# Patient Record
Sex: Male | Born: 1975
Health system: Southern US, Community
[De-identification: ages and names within clinical notes are randomized; demographics above are authoritative.]

## PROBLEM LIST (undated history)

## (undated) DIAGNOSIS — T8859XA Other complications of anesthesia, initial encounter: Secondary | ICD-10-CM

## (undated) DIAGNOSIS — E559 Vitamin D deficiency, unspecified: Secondary | ICD-10-CM

## (undated) DIAGNOSIS — K4090 Unilateral inguinal hernia, without obstruction or gangrene, not specified as recurrent: Secondary | ICD-10-CM

## (undated) DIAGNOSIS — N429 Disorder of prostate, unspecified: Secondary | ICD-10-CM

## (undated) DIAGNOSIS — T4145XA Adverse effect of unspecified anesthetic, initial encounter: Secondary | ICD-10-CM

## (undated) DIAGNOSIS — R55 Syncope and collapse: Secondary | ICD-10-CM

## (undated) DIAGNOSIS — F419 Anxiety disorder, unspecified: Secondary | ICD-10-CM

## (undated) HISTORY — DX: Disorder of prostate, unspecified: N42.9

## (undated) HISTORY — PX: HERNIA REPAIR: SHX51

## (undated) HISTORY — DX: Syncope and collapse: R55

## (undated) HISTORY — DX: Vitamin D deficiency, unspecified: E55.9

## (undated) HISTORY — DX: Unilateral inguinal hernia, without obstruction or gangrene, not specified as recurrent: K40.90

## (undated) HISTORY — PX: OTHER SURGICAL HISTORY: SHX169

---

## 2001-07-14 ENCOUNTER — Encounter: Payer: Self-pay | Admitting: Pediatrics

## 2001-07-14 ENCOUNTER — Encounter: Admission: RE | Admit: 2001-07-14 | Discharge: 2001-07-14 | Payer: Self-pay | Admitting: Pediatrics

## 2009-03-21 ENCOUNTER — Encounter: Admission: RE | Admit: 2009-03-21 | Discharge: 2009-03-21 | Payer: Self-pay | Admitting: Cardiovascular Disease

## 2011-06-05 ENCOUNTER — Encounter (INDEPENDENT_AMBULATORY_CARE_PROVIDER_SITE_OTHER): Payer: Self-pay | Admitting: Surgery

## 2011-06-05 ENCOUNTER — Ambulatory Visit (INDEPENDENT_AMBULATORY_CARE_PROVIDER_SITE_OTHER): Payer: BC Managed Care – PPO | Admitting: Surgery

## 2011-06-05 VITALS — BP 118/76 | HR 72 | Temp 97.9°F | Resp 18 | Ht 65.0 in | Wt 131.6 lb

## 2011-06-05 DIAGNOSIS — K4090 Unilateral inguinal hernia, without obstruction or gangrene, not specified as recurrent: Secondary | ICD-10-CM

## 2011-06-05 NOTE — Patient Instructions (Signed)
Inguinal Hernia, Adult  Muscles help keep everything in the body in its proper place. But if a weak spot in the muscles develops, something can poke through. That is called a hernia. When this happens in the lower part of the belly (abdomen), it is called an inguinal hernia. (It takes its name from a part of the body in this region called the inguinal canal.) A weak spot in the wall of muscles lets some fat or part of the small intestine bulge through. An inguinal hernia can develop at any age. Men get them more often than women.  CAUSES   In adults, an inguinal hernia develops over time.  · It can be triggered by:  · Suddenly straining the muscles of the lower abdomen.  · Lifting heavy objects.  · Straining to have a bowel movement. Difficult bowel movements (constipation) can lead to this.  · Constant coughing. This may be caused by smoking or lung disease.  · Being overweight.  · Being pregnant.  · Working at a job that requires long periods of standing or heavy lifting.  · Having had an inguinal hernia before.  One type can be an emergency situation. It is called a strangulated inguinal hernia. It develops if part of the small intestine slips through the weak spot and cannot get back into the abdomen. The blood supply can be cut off. If that happens, part of the intestine may die. This situation requires emergency surgery.  SYMPTOMS   Often, a small inguinal hernia has no symptoms. It is found when a healthcare provider does a physical exam. Larger hernias usually have symptoms.   · In adults, symptoms may include:  · A lump in the groin. This is easier to see when the person is standing. It might disappear when lying down.  · In men, a lump in the scrotum.  · Pain or burning in the groin. This occurs especially when lifting, straining or coughing.  · A dull ache or feeling of pressure in the groin.  · Signs of a strangulated hernia can include:  · A bulge in the groin that becomes very painful and tender to the  touch.  · A bulge that turns red or purple.  · Fever, nausea and vomiting.  · Inability to have a bowel movement or to pass gas.  DIAGNOSIS   To decide if you have an inguinal hernia, a healthcare provider will probably do a physical examination.  · This will include asking questions about any symptoms you have noticed.  · The healthcare provider might feel the groin area and ask you to cough. If an inguinal hernia is felt, the healthcare provider may try to slide it back into the abdomen.  · Usually no other tests are needed.  TREATMENT   Treatments can vary. The size of the hernia makes a difference. Options include:  · Watchful waiting. This is often suggested if the hernia is small and you have had no symptoms.  · No medical procedure will be done unless symptoms develop.  · You will need to watch closely for symptoms. If any occur, contact your healthcare provider right away.  · Surgery. This is used if the hernia is larger or you have symptoms.  · Open surgery. This is usually an outpatient procedure (you will not stay overnight in a hospital). An cut (incision) is made through the skin in the groin. The hernia is put back inside the abdomen. The weak area in the muscles is   then repaired by herniorrhaphy or hernioplasty. Herniorrhaphy: in this type of surgery, the weak muscles are sewn back together. Hernioplasty: a patch or mesh is used to close the weak area in the abdominal wall.  · Laparoscopy. In this procedure, a surgeon makes small incisions. A thin tube with a tiny video camera (called a laparoscope) is put into the abdomen. The surgeon repairs the hernia with mesh by looking with the video camera and using two long instruments.  HOME CARE INSTRUCTIONS   · After surgery to repair an inguinal hernia:  · You will need to take pain medicine prescribed by your healthcare provider. Follow all directions carefully.  · You will need to take care of the wound from the incision.  · Your activity will be  restricted for awhile. This will probably include no heavy lifting for several weeks. You also should not do anything too active for a few weeks. When you can return to work will depend on the type of job that you have.  · During "watchful waiting" periods, you should:  · Maintain a healthy weight.  · Eat a diet high in fiber (fruits, vegetables and whole grains).  · Drink plenty of fluids to avoid constipation. This means drinking enough water and other liquids to keep your urine clear or pale yellow.  · Do not lift heavy objects.  · Do not stand for long periods of time.  · Quit smoking. This should keep you from developing a frequent cough.  SEEK MEDICAL CARE IF:   · A bulge develops in your groin area.  · You feel pain, a burning sensation or pressure in the groin. This might be worse if you are lifting or straining.  · You develop a fever of more than 100.5° F (38.1° C).  SEEK IMMEDIATE MEDICAL CARE IF:   · Pain in the groin increases suddenly.  · A bulge in the groin gets bigger suddenly and does not go down.  · For men, there is sudden pain in the scrotum. Or, the size of the scrotum increases.  · A bulge in the groin area becomes red or purple and is painful to touch.  · You have nausea or vomiting that does not go away.  · You feel your heart beating much faster than normal.  · You cannot have a bowel movement or pass gas.  · You develop a fever of more than 102.0° F (38.9° C).  Document Released: 07/28/2008 Document Revised: 02/28/2011 Document Reviewed: 07/28/2008  Exit[employer]® Patient Information ©2012 [employer].

## 2011-06-05 NOTE — Progress Notes (Signed)
Chief Complaint  Patient presents with  . Inguinal Hernia    Right inguinal hernia - referral by Dr. Marcine Matar, [facility]    HISTORY: Patient is a 36 year old white male referred by his urologist for right inguinal hernia. Patient has noted a bulge on the right side for several months. It causes occasional discomfort with physical activity. He has had no signs or symptoms of obstruction. Patient had a pediatric hernia repaired on the left side. He has had problems with left varicocele and had laparoscopic surgery. He has chronic left lower quadrant abdominal pain. Patient presents today for evaluation for repair of right inguinal hernia.  Past Medical History  Diagnosis Date  . Inguinal hernia without mention of obstruction or gangrene, unilateral or unspecified, (not specified as recurrent)   . Unspecified disorder of prostate   . Abdominal pain   . Syncope, vasovagal      No current outpatient prescriptions on file.     No Known Allergies   Family History  Problem Relation Age of Onset  . Nephrolithiasis    . Cancer Paternal Aunt     breast  . Cancer Paternal Grandmother     lung     History   Social History  . Marital Status: Single    Spouse Name: N/A    Number of Children: N/A  . Years of Education: N/A   Social History Main Topics  . Smoking status: Never Smoker   . Smokeless tobacco: Never Used  . Alcohol Use: No  . Drug Use: No  . Sexually Active: None   Other Topics Concern  . None   Social History Narrative  . None     REVIEW OF SYSTEMS - PERTINENT POSITIVES ONLY: Intermittent discomfort right groin with physical activity. No history of intestinal obstruction. Chronic left lower quadrant abdominal pain.  EXAM: Filed Vitals:   06/05/11 1036  BP: 118/76  Pulse: 72  Temp: 97.9 F (36.6 C)  Resp: 18    HEENT: normocephalic; pupils equal and reactive; sclerae clear; dentition good; mucous membranes moist NECK:  symmetric on  extension; no palpable anterior or posterior cervical lymphadenopathy; no supraclavicular masses; no tenderness CHEST: clear to auscultation bilaterally without rales, rhonchi, or wheezes CARDIAC: regular rate and rhythm without significant murmur; peripheral pulses are full ABDOMEN: soft without distension; bowel sounds present; no mass; no hepatosplenomegaly; no hernia; Well-healed surgical incisions. Palpation in the right inguinal canal with cough and Valsalva shows an obvious direct inguinal hernia. This is easily reducible. Palpation in the left inguinal canal with cough and Valsalva shows no sign of inguinal hernia. There is slight laxity of the inguinal floor. EXT:  non-tender without edema; no deformity NEURO: no gross focal deficits; no sign of tremor   LABORATORY RESULTS: See [facility] (CHL-Epic) for most recent results   RADIOLOGY RESULTS: See [facility] (CHL-Epic) for most recent results   IMPRESSION: #1 right inguinal hernia, reducible, mildly symptomatic #2 history of left inguinal hernia repaired as an infant, history of left varicocele repaired laparoscopically #3 history of flash pulmonary edema with anesthesia  PLAN: The patient and I discussed all of the above issues. We discussed the repair of the right inguinal hernia at length. I provided him with written literature. Patient would like to proceed with repair in the near future.  We discussed general anesthesia. We discussed his episode of flash pulmonary edema with a previous procedure. In view of this complication, I would like to have this procedure performed at  the main hospital operating room. We will arrange for preoperative anesthesia consultation. Patient will still be eligible for outpatient surgery.  The patient and I discussed right inguinal hernia repair with mesh. We discussed alternative techniques for repair including laparoscopic surgery. I have quoted him approximately a 2% risk of  recurrence with open repair. We discussed the risk and benefits. He would like to proceed in the near future.  The risks and benefits of the procedure have been discussed at length with the patient.  The patient understands the proposed procedure, potential alternative treatments, and the course of recovery to be expected.  All of the patient's questions have been answered at this time.  The patient wishes to proceed with surgery and will schedule a date for the procedure through our office staff.  Velora Heckler, MD, FACS General & Endocrine Surgery [facility]   Visit Diagnoses: 1. Inguinal hernia unilateral, non-recurrent, right     Primary Care Physician: Jefferey Pica, MD, MD  Urology:  Dr. Marcine Matar

## 2011-06-18 ENCOUNTER — Ambulatory Visit (INDEPENDENT_AMBULATORY_CARE_PROVIDER_SITE_OTHER): Payer: Self-pay | Admitting: General Surgery

## 2011-06-24 ENCOUNTER — Encounter (HOSPITAL_COMMUNITY): Payer: Self-pay | Admitting: Pharmacy Technician

## 2011-07-03 ENCOUNTER — Encounter (HOSPITAL_COMMUNITY)
Admission: RE | Admit: 2011-07-03 | Discharge: 2011-07-03 | Disposition: A | Discharge status: Home / Self Care | Payer: BC Managed Care – PPO | Source: Ambulatory Visit | Attending: Surgery | Admitting: Surgery

## 2011-07-03 ENCOUNTER — Encounter (HOSPITAL_COMMUNITY): Payer: Self-pay

## 2011-07-03 HISTORY — DX: Other complications of anesthesia, initial encounter: T88.59XA

## 2011-07-03 HISTORY — DX: Anxiety disorder, unspecified: F41.9

## 2011-07-03 HISTORY — DX: Adverse effect of unspecified anesthetic, initial encounter: T41.45XA

## 2011-07-03 NOTE — Patient Instructions (Signed)
YOUR SURGERY IS SCHEDULED ON:  Friday 4/12  AT 7:30 AM  REPORT TO [facility] AT: 5:30 AM      PHONE # FOR SHORT STAY IS [phone]  DO NOT EAT OR DRINK ANYTHING AFTER MIDNIGHT THE NIGHT BEFORE YOUR SURGERY.  YOU MAY BRUSH YOUR TEETH, RINSE OUT YOUR MOUTH--BUT NO WATER, NO FOOD, NO CHEWING GUM, NO MINTS, NO CANDIES, NO CHEWING TOBACCO.  PLEASE TAKE THE FOLLOWING MEDICATIONS THE AM OF YOUR SURGERY WITH A FEW SIPS OF WATER:  NO MEDS TO TAKE    IF YOU USE INHALERS--USE YOUR INHALERS THE AM OF YOUR SURGERY AND BRING INHALERS TO THE HOSPITAL -TAKE TO SURGERY.    IF YOU ARE DIABETIC:  DO NOT TAKE ANY DIABETIC MEDICATIONS THE AM OF YOUR SURGERY.  IF YOU TAKE INSULIN IN THE EVENINGS--PLEASE ONLY TAKE 1/2 NORMAL EVENING DOSE THE NIGHT BEFORE YOUR SURGERY.  NO INSULIN THE AM OF YOUR SURGERY.  IF YOU HAVE SLEEP APNEA AND USE CPAP OR BIPAP--PLEASE BRING THE MASK --NOT THE MACHINE-NOT THE TUBING   -JUST THE MASK. DO NOT BRING VALUABLES, MONEY, CREDIT CARDS.  CONTACT LENS, DENTURES / PARTIALS, GLASSES SHOULD NOT BE WORN TO SURGERY AND IN MOST CASES-HEARING AIDS WILL NEED TO BE REMOVED.  BRING YOUR GLASSES CASE, ANY EQUIPMENT NEEDED FOR YOUR CONTACT LENS. FOR PATIENTS ADMITTED TO THE HOSPITAL--CHECK OUT TIME THE DAY OF DISCHARGE IS 11:00 AM.  ALL INPATIENT ROOMS ARE PRIVATE - WITH BATHROOM, TELEPHONE, TELEVISION AND WIFI INTERNET. IF YOU ARE BEING DISCHARGED THE SAME DAY OF YOUR SURGERY--YOU CAN NOT DRIVE YOURSELF HOME--AND SHOULD NOT GO HOME ALONE BY TAXI OR BUS.  NO DRIVING OR OPERATING MACHINERY FOR 24 HOURS FOLLOWING ANESTHESIA / PAIN MEDICATIONS.                            SPECIAL INSTRUCTIONS:  CHLORHEXIDINE SOAP SHOWER (other brand names are Betasept and Hibiclens ) PLEASE SHOWER WITH CHLORHEXIDINE THE NIGHT BEFORE YOUR SURGERY AND THE AM OF YOUR SURGERY. DO NOT USE CHLORHEXIDINE ON YOUR FACE OR PRIVATE AREAS--YOU MAY USE YOUR NORMAL SOAP THOSE AREAS AND YOUR NORMAL SHAMPOO.    WOMEN SHOULD AVOID SHAVING UNDER ARMS AND SHAVING LEGS 48 HOURS BEFORE USING CHLORHEXIDINE TO AVOID SKIN IRRITATION.  DO NOT USE IF ALLERGIC TO CHLORHEXIDINE.  PLEASE READ OVER ANY  FACT SHEETS THAT YOU WERE GIVEN: MRSA INFORMATION

## 2011-07-03 NOTE — Pre-Procedure Instructions (Signed)
ANESTHESIOLGIST CONSULT WAS COMPLETED TODAY BY DR. ROSE AND DR. ROSE STATED PT DID NOT NEED ANY PREOP LABS, EKG OR CXR.  DR. ROSE DID REVIEW PT'S LAST CARDIOLOGY OFFICE NOTE WITH EKG FROM 08/27/10 -DR. [facility] AND [facility] EXERCISE STRESS TEST REPORT 03/23/2009 AND ECHO REPORT 03/21/09 FROM [facility].  REPORTS ARE ON PT'S CHART.  MR C SPINE REPORT ON CHART FROM 03/21/09 FROM [facility].

## 2011-07-03 NOTE — Anesthesia Preprocedure Evaluation (Addendum)
Anesthesia Evaluation  Patient identified by MRN, date of birth, ID band Patient awake  General Assessment Comment:H/o flash pulmonary edema  Reviewed: Allergy & Precautions, H&P , NPO status , Patient's Chart, lab work & pertinent test results, reviewed documented beta blocker date and time   Airway Mallampati: II TM Distance: >3 FB Neck ROM: Full    Dental No notable dental hx.    Pulmonary neg pulmonary ROS,  breath sounds clear to auscultation  Pulmonary exam normal       Cardiovascular negative cardio ROS  Rhythm:Regular Rate:Normal     Neuro/Psych negative neurological ROS  negative psych ROS   GI/Hepatic negative GI ROS, Neg liver ROS,   Endo/Other  negative endocrine ROS  Renal/GU negative Renal ROS  negative genitourinary   Musculoskeletal negative musculoskeletal ROS (+)   Abdominal   Peds negative pediatric ROS (+)  Hematology negative hematology ROS (+)   Anesthesia Other Findings   Reproductive/Obstetrics negative OB ROS                          Anesthesia Physical Anesthesia Plan  ASA: I  Anesthesia Plan: General   Post-op Pain Management:    Induction: Intravenous  Airway Management Planned: LMA  Additional Equipment:   Intra-op Plan:   Post-operative Plan:   Informed Consent: I have reviewed the patients History and Physical, chart, labs and discussed the procedure including the risks, benefits and alternatives for the proposed anesthesia with the patient or authorized representative who has indicated his/her understanding and acceptance.   Dental advisory given  Plan Discussed with: CRNA  Anesthesia Plan Comments:         Anesthesia Quick Evaluation

## 2011-07-05 ENCOUNTER — Encounter (HOSPITAL_COMMUNITY): Payer: Self-pay | Admitting: Registered Nurse

## 2011-07-05 ENCOUNTER — Encounter (HOSPITAL_COMMUNITY): Payer: Self-pay | Admitting: *Deleted

## 2011-07-05 ENCOUNTER — Encounter (HOSPITAL_COMMUNITY)
Admission: RE | Disposition: A | Discharge status: Home / Self Care | Payer: Self-pay | Source: Ambulatory Visit | Attending: Surgery

## 2011-07-05 ENCOUNTER — Telehealth (INDEPENDENT_AMBULATORY_CARE_PROVIDER_SITE_OTHER): Payer: Self-pay

## 2011-07-05 ENCOUNTER — Ambulatory Visit (HOSPITAL_COMMUNITY)
Admission: RE | Admit: 2011-07-05 | Discharge: 2011-07-05 | Disposition: A | Discharge status: Home / Self Care | Payer: BC Managed Care – PPO | Source: Ambulatory Visit | Attending: Surgery | Admitting: Surgery

## 2011-07-05 ENCOUNTER — Encounter (HOSPITAL_COMMUNITY): Payer: Self-pay | Admitting: Anesthesiology

## 2011-07-05 ENCOUNTER — Ambulatory Visit (HOSPITAL_COMMUNITY): Payer: BC Managed Care – PPO | Admitting: Registered Nurse

## 2011-07-05 DIAGNOSIS — K4091 Unilateral inguinal hernia, without obstruction or gangrene, recurrent: Secondary | ICD-10-CM

## 2011-07-05 DIAGNOSIS — Z01812 Encounter for preprocedural laboratory examination: Secondary | ICD-10-CM | POA: Insufficient documentation

## 2011-07-05 DIAGNOSIS — K4090 Unilateral inguinal hernia, without obstruction or gangrene, not specified as recurrent: Secondary | ICD-10-CM

## 2011-07-05 HISTORY — PX: INGUINAL HERNIA REPAIR: SHX194

## 2011-07-05 SURGERY — REPAIR, HERNIA, INGUINAL, ADULT
Anesthesia: General | Site: Abdomen | Laterality: Right | Wound class: Clean

## 2011-07-05 MED ORDER — ACETAMINOPHEN 10 MG/ML IV SOLN
1000.0000 mg | Freq: Once | INTRAVENOUS | Status: AC
  Administered 2011-07-05: 1000 mg via INTRAVENOUS
  Filled 2011-07-05: qty 100

## 2011-07-05 MED ORDER — HYDROCODONE-ACETAMINOPHEN 7.5-500 MG/15ML PO SOLN
15.0000 mL | ORAL | Status: DC | PRN
  Administered 2011-07-05: 15 mL via ORAL

## 2011-07-05 MED ORDER — HYDROMORPHONE HCL PF 1 MG/ML IJ SOLN: 0.2500 mg | INTRAMUSCULAR | Status: DC | PRN

## 2011-07-05 MED ORDER — BUPIVACAINE HCL (PF) 0.5 % IJ SOLN
INTRAMUSCULAR | Status: AC
  Filled 2011-07-05: qty 30

## 2011-07-05 MED ORDER — PROPOFOL 10 MG/ML IV EMUL
INTRAVENOUS | Status: DC | PRN
  Administered 2011-07-05: 200 mg via INTRAVENOUS
  Administered 2011-07-05: 70 mg via INTRAVENOUS

## 2011-07-05 MED ORDER — DROPERIDOL 2.5 MG/ML IJ SOLN
INTRAMUSCULAR | Status: DC | PRN
  Administered 2011-07-05: 0.625 mg via INTRAVENOUS

## 2011-07-05 MED ORDER — BUPIVACAINE LIPOSOME 1.3 % IJ SUSP
20.0000 mL | Freq: Once | INTRAMUSCULAR | Status: DC
  Filled 2011-07-05: qty 20

## 2011-07-05 MED ORDER — MIDAZOLAM HCL 10 MG/2ML IJ SOLN
4.0000 mg | Freq: Once | INTRAMUSCULAR | Status: AC
  Administered 2011-07-05: 4 mg via INTRAMUSCULAR

## 2011-07-05 MED ORDER — NEOSTIGMINE METHYLSULFATE 1 MG/ML IJ SOLN
INTRAMUSCULAR | Status: DC | PRN
  Administered 2011-07-05: 4 mg via INTRAVENOUS

## 2011-07-05 MED ORDER — MIDAZOLAM HCL 5 MG/5ML IJ SOLN
INTRAMUSCULAR | Status: DC | PRN
  Administered 2011-07-05: 1 mg via INTRAVENOUS
  Administered 2011-07-05: 2 mg via INTRAVENOUS

## 2011-07-05 MED ORDER — HYDROCODONE-ACETAMINOPHEN 10-325 MG PO TABS: 1.0000 | ORAL_TABLET | ORAL | Status: AC | PRN

## 2011-07-05 MED ORDER — SUFENTANIL CITRATE 50 MCG/ML IV SOLN
INTRAVENOUS | Status: DC | PRN
  Administered 2011-07-05 (×3): 10 ug via INTRAVENOUS

## 2011-07-05 MED ORDER — ONDANSETRON HCL 4 MG/2ML IJ SOLN
INTRAMUSCULAR | Status: DC | PRN
  Administered 2011-07-05: 4 mg via INTRAVENOUS

## 2011-07-05 MED ORDER — LACTATED RINGERS IV SOLN
INTRAVENOUS | Status: DC | PRN
  Administered 2011-07-05 (×2): via INTRAVENOUS

## 2011-07-05 MED ORDER — LIDOCAINE HCL 4 % MT SOLN
OROMUCOSAL | Status: DC | PRN
  Administered 2011-07-05: 4 mL via TOPICAL

## 2011-07-05 MED ORDER — LIDOCAINE HCL (CARDIAC) 20 MG/ML IV SOLN
INTRAVENOUS | Status: DC | PRN
  Administered 2011-07-05: 50 mg via INTRAVENOUS

## 2011-07-05 MED ORDER — 0.9 % SODIUM CHLORIDE (POUR BTL) OPTIME
TOPICAL | Status: DC | PRN
  Administered 2011-07-05: 1000 mL

## 2011-07-05 MED ORDER — BUPIVACAINE HCL (PF) 0.5 % IJ SOLN
INTRAMUSCULAR | Status: DC | PRN
  Administered 2011-07-05: 20 mL

## 2011-07-05 MED ORDER — ROCURONIUM BROMIDE 100 MG/10ML IV SOLN
INTRAVENOUS | Status: DC | PRN
  Administered 2011-07-05: 50 mg via INTRAVENOUS
  Administered 2011-07-05: 10 mg via INTRAVENOUS

## 2011-07-05 MED ORDER — GLYCOPYRROLATE 0.2 MG/ML IJ SOLN
INTRAMUSCULAR | Status: DC | PRN
  Administered 2011-07-05: 0.1 mg via INTRAVENOUS
  Administered 2011-07-05: 0.6 mg via INTRAVENOUS

## 2011-07-05 MED ORDER — MIDAZOLAM HCL 10 MG/2ML IJ SOLN
INTRAMUSCULAR | Status: AC
  Filled 2011-07-05: qty 2

## 2011-07-05 MED ORDER — CEFAZOLIN SODIUM 1-5 GM-% IV SOLN
INTRAVENOUS | Status: AC
  Filled 2011-07-05: qty 50

## 2011-07-05 MED ORDER — LACTATED RINGERS IV SOLN: INTRAVENOUS | Status: DC

## 2011-07-05 MED ORDER — CEFAZOLIN SODIUM 1-5 GM-% IV SOLN
1.0000 g | INTRAVENOUS | Status: AC
  Administered 2011-07-05: 1 g via INTRAVENOUS

## 2011-07-05 SURGICAL SUPPLY — 38 items
APPLICATOR COTTON TIP 6IN STRL (MISCELLANEOUS) IMPLANT
BENZOIN TINCTURE PRP APPL 2/3 (GAUZE/BANDAGES/DRESSINGS) ×2 IMPLANT
BLADE HEX COATED 2.75 (ELECTRODE) ×2 IMPLANT
BLADE SURG 15 STRL LF DISP TIS (BLADE) ×1 IMPLANT
BLADE SURG 15 STRL SS (BLADE) ×1
BLADE SURG SZ10 CARB STEEL (BLADE) ×2 IMPLANT
CANISTER SUCTION 2500CC (MISCELLANEOUS) ×2 IMPLANT
CLOTH BEACON ORANGE TIMEOUT ST (SAFETY) ×2 IMPLANT
DECANTER SPIKE VIAL GLASS SM (MISCELLANEOUS) ×2 IMPLANT
DRAIN PENROSE 18X1/2 LTX STRL (DRAIN) ×2 IMPLANT
DRAPE LAPAROTOMY TRNSV 102X78 (DRAPE) ×2 IMPLANT
ELECT REM PT RETURN 9FT ADLT (ELECTROSURGICAL) ×2
ELECTRODE REM PT RTRN 9FT ADLT (ELECTROSURGICAL) ×1 IMPLANT
GLOVE BIOGEL PI IND STRL 7.0 (GLOVE) ×1 IMPLANT
GLOVE BIOGEL PI INDICATOR 7.0 (GLOVE) ×1
GLOVE SURG ORTHO 8.0 STRL STRW (GLOVE) ×2 IMPLANT
GOWN STRL NON-REIN LRG LVL3 (GOWN DISPOSABLE) ×2 IMPLANT
GOWN STRL REIN XL XLG (GOWN DISPOSABLE) ×4 IMPLANT
KIT BASIN OR (CUSTOM PROCEDURE TRAY) ×2 IMPLANT
MESH ULTRAPRO 3X6 7.6X15CM (Mesh General) ×2 IMPLANT
NEEDLE HYPO 25X1 1.5 SAFETY (NEEDLE) ×2 IMPLANT
NS IRRIG 1000ML POUR BTL (IV SOLUTION) ×2 IMPLANT
PACK BASIC VI WITH GOWN DISP (CUSTOM PROCEDURE TRAY) ×2 IMPLANT
PENCIL BUTTON HOLSTER BLD 10FT (ELECTRODE) ×2 IMPLANT
SPONGE GAUZE 4X4 12PLY (GAUZE/BANDAGES/DRESSINGS) ×2 IMPLANT
SPONGE LAP 4X18 X RAY DECT (DISPOSABLE) ×2 IMPLANT
STRIP CLOSURE SKIN 1/2X4 (GAUZE/BANDAGES/DRESSINGS) ×2 IMPLANT
SUT MNCRL AB 4-0 PS2 18 (SUTURE) ×2 IMPLANT
SUT NOVA NAB GS-22 2 0 T19 (SUTURE) ×4 IMPLANT
SUT SILK 2 0 SH (SUTURE) IMPLANT
SUT SILK 3 0 (SUTURE) ×1
SUT SILK 3-0 18XBRD TIE 12 (SUTURE) ×1 IMPLANT
SUT VIC AB 3-0 SH 18 (SUTURE) ×2 IMPLANT
SYR BULB IRRIGATION 50ML (SYRINGE) ×2 IMPLANT
SYR CONTROL 10ML LL (SYRINGE) ×2 IMPLANT
TAPE CLOTH SURG 4X10 WHT LF (GAUZE/BANDAGES/DRESSINGS) ×2 IMPLANT
TOWEL OR 17X26 10 PK STRL BLUE (TOWEL DISPOSABLE) ×2 IMPLANT
YANKAUER SUCT BULB TIP 10FT TU (MISCELLANEOUS) ×2 IMPLANT

## 2011-07-05 NOTE — Telephone Encounter (Signed)
Pt calling in b/c just had RIH by Dr Gerrit Friends but he feels he is going to pass out when he urinates. The pt has a hx of vaso-vagal reaction so the pt just lied down and put his feet up in the air. I did go and speak with Dr Ezzard Standing b/c Dr Gerrit Friends pm off after call he advised for pt to sit while urinating and sit down afterwards. The pt understands. I did advise if his symptoms get worse the pt to go to the ER.

## 2011-07-05 NOTE — Anesthesia Postprocedure Evaluation (Signed)
  Anesthesia Post-op Note  Patient: Juan Perez  Procedure(s) Performed: Procedure(s) (LRB): HERNIA REPAIR INGUINAL ADULT (Right) INSERTION OF MESH (Right)  Patient Location: PACU  Anesthesia Type: General  Level of Consciousness: oriented and sedated  Airway and Oxygen Therapy: Patient Spontanous Breathing  Post-op Pain: mild  Post-op Assessment: Post-op Vital signs reviewed, Patient's Cardiovascular Status Stable, Respiratory Function Stable and Patent Airway  Post-op Vital Signs: stable  Complications: No apparent anesthesia complications

## 2011-07-05 NOTE — Progress Notes (Signed)
C/o sore and irritated throat. Reassured patient that this will improve over time and with sipping. May use warm sat water gargels at home.

## 2011-07-05 NOTE — Transfer of Care (Signed)
Immediate Anesthesia Transfer of Care Note  Patient: Juan Perez  Procedure(s) Performed: Procedure(s) (LRB): HERNIA REPAIR INGUINAL ADULT (Right) INSERTION OF MESH (Right)  Patient Location: PACU  Anesthesia Type: General  Level of Consciousness: awake, alert , oriented and patient cooperative  Airway & Oxygen Therapy: Patient Spontanous Breathing and Patient connected to face mask oxygen  Post-op Assessment: Report given to PACU RN, Post -op Vital signs reviewed and stable and Patient moving all extremities X 4  Post vital signs: stable  Complications: No apparent anesthesia complications

## 2011-07-05 NOTE — Interval H&P Note (Signed)
History and Physical Interval Note:  07/05/2011 1:19 PM  Juan Perez  has presented today for surgery, with the diagnosis of inguinal hernia.  The various methods of treatment have been discussed with the patient and family. After consideration of risks, benefits and other options for treatment, the patient has consented to    Procedure(s) (LRB): HERNIA REPAIR INGUINAL ADULT (Right) INSERTION OF MESH (Right) as a surgical intervention .    The patients' history has been reviewed, patient examined, no change in status, stable for surgery.  I have reviewed the patients' chart and labs.  Questions were answered to the patient's satisfaction.    Velora Heckler, MD, [facility] Office: [phone]   Euriah Matlack Judie Petit

## 2011-07-05 NOTE — H&P (View-Only) (Signed)
Chief Complaint  Patient presents with  . Inguinal Hernia    Right inguinal hernia - referral by Dr. Marcine Matar, [facility]    HISTORY: Patient is a 36 year old white male referred by his urologist for right inguinal hernia. Patient has noted a bulge on the right side for several months. It causes occasional discomfort with physical activity. He has had no signs or symptoms of obstruction. Patient had a pediatric hernia repaired on the left side. He has had problems with left varicocele and had laparoscopic surgery. He has chronic left lower quadrant abdominal pain. Patient presents today for evaluation for repair of right inguinal hernia.  Past Medical History  Diagnosis Date  . Inguinal hernia without mention of obstruction or gangrene, unilateral or unspecified, (not specified as recurrent)   . Unspecified disorder of prostate   . Abdominal pain   . Syncope, vasovagal      No current outpatient prescriptions on file.     No Known Allergies   Family History  Problem Relation Age of Onset  . Nephrolithiasis    . Cancer Paternal Aunt     breast  . Cancer Paternal Grandmother     lung     History   Social History  . Marital Status: Single    Spouse Name: N/A    Number of Children: N/A  . Years of Education: N/A   Social History Main Topics  . Smoking status: Never Smoker   . Smokeless tobacco: Never Used  . Alcohol Use: No  . Drug Use: No  . Sexually Active: None   Other Topics Concern  . None   Social History Narrative  . None     REVIEW OF SYSTEMS - PERTINENT POSITIVES ONLY: Intermittent discomfort right groin with physical activity. No history of intestinal obstruction. Chronic left lower quadrant abdominal pain.  EXAM: Filed Vitals:   06/05/11 1036  BP: 118/76  Pulse: 72  Temp: 97.9 F (36.6 C)  Resp: 18    HEENT: normocephalic; pupils equal and reactive; sclerae clear; dentition good; mucous membranes moist NECK:  symmetric on  extension; no palpable anterior or posterior cervical lymphadenopathy; no supraclavicular masses; no tenderness CHEST: clear to auscultation bilaterally without rales, rhonchi, or wheezes CARDIAC: regular rate and rhythm without significant murmur; peripheral pulses are full ABDOMEN: soft without distension; bowel sounds present; no mass; no hepatosplenomegaly; no hernia; Well-healed surgical incisions. Palpation in the right inguinal canal with cough and Valsalva shows an obvious direct inguinal hernia. This is easily reducible. Palpation in the left inguinal canal with cough and Valsalva shows no sign of inguinal hernia. There is slight laxity of the inguinal floor. EXT:  non-tender without edema; no deformity NEURO: no gross focal deficits; no sign of tremor   LABORATORY RESULTS: See [facility] (CHL-Epic) for most recent results   RADIOLOGY RESULTS: See [facility] (CHL-Epic) for most recent results   IMPRESSION: #1 right inguinal hernia, reducible, mildly symptomatic #2 history of left inguinal hernia repaired as an infant, history of left varicocele repaired laparoscopically #3 history of flash pulmonary edema with anesthesia  PLAN: The patient and I discussed all of the above issues. We discussed the repair of the right inguinal hernia at length. I provided him with written literature. Patient would like to proceed with repair in the near future.  We discussed general anesthesia. We discussed his episode of flash pulmonary edema with a previous procedure. In view of this complication, I would like to have this procedure performed at  the main hospital operating room. We will arrange for preoperative anesthesia consultation. Patient will still be eligible for outpatient surgery.  The patient and I discussed right inguinal hernia repair with mesh. We discussed alternative techniques for repair including laparoscopic surgery. I have quoted him approximately a 2% risk of  recurrence with open repair. We discussed the risk and benefits. He would like to proceed in the near future.  The risks and benefits of the procedure have been discussed at length with the patient.  The patient understands the proposed procedure, potential alternative treatments, and the course of recovery to be expected.  All of the patient's questions have been answered at this time.  The patient wishes to proceed with surgery and will schedule a date for the procedure through our office staff.  Velora Heckler, MD, FACS General & Endocrine Surgery [facility]   Visit Diagnoses: 1. Inguinal hernia unilateral, non-recurrent, right     Primary Care Physician: Jefferey Pica, MD, MD  Urology:  Dr. Marcine Matar

## 2011-07-05 NOTE — Telephone Encounter (Signed)
Patient will call to confirm appointment choice.

## 2011-07-05 NOTE — Brief Op Note (Signed)
07/05/2011  8:49 AM  PATIENT:  Juan Perez  36 y.o. male  PRE-OPERATIVE DIAGNOSIS:  Recurrent right inguinal hernia, direct, reducible  POST-OPERATIVE DIAGNOSIS:  same  PROCEDURE:  Repair of recurrent right inguinal hernia with Ethicon Ultrapro mesh  SURGEON:  Surgeon(s) and Role:    * Velora Heckler, MD - Primary  ANESTHESIA:   general  EBL:  Total I/O In: 1000 [I.V.:1000] Out: -   BLOOD ADMINISTERED:none  DRAINS: none   LOCAL MEDICATIONS USED:  MARCAINE     SPECIMEN:  No Specimen  DISPOSITION OF SPECIMEN:  N/A  COUNTS:  YES  TOURNIQUET:  * No tourniquets in log *  DICTATION: .Other Dictation: Dictation Number [id number]  PLAN OF CARE: Discharge to home after PACU  PATIENT DISPOSITION:  PACU - hemodynamically stable.   Delay start of Pharmacological VTE agent (>24hrs) due to surgical blood loss or risk of bleeding: yes  Velora Heckler, MD, [facility] Office: [phone]

## 2011-07-05 NOTE — Progress Notes (Signed)
Dr. Schuster made aware of patient's heart rates 

## 2011-07-06 NOTE — Op Note (Signed)
Juan, Perez NO.:  [id number]  MEDICAL RECORD NO.:  [id number]  LOCATION:  [facility]                         FACILITY:  [facility]  PHYSICIAN:  Velora Heckler, MD      DATE OF BIRTH:  [birth year]  DATE OF PROCEDURE:  07/05/2011                              OPERATIVE REPORT   PREOPERATIVE DIAGNOSIS:  Recurrent right inguinal hernia.  POSTOPERATIVE DIAGNOSIS:  Recurrent right inguinal hernia.  PROCEDURE:  Repair of recurrent right inguinal hernia with Ethicon UltraPro Mesh.  SURGEON:  Velora Heckler, MD, FACS  ANESTHESIA:  General per Dr. Lestine Box.  ESTIMATED BLOOD LOSS:  Minimal.  PREPARATION:  ChloraPrep.  COMPLICATIONS:  None.  INDICATIONS:  The patient is a 36 year old white male with previous history of right inguinal hernia repair as an infant.  The patient has also undergone left varicocele repair.  The patient has developed a right inguinal hernia with a visible bulge over the past several months. On physical exam, this is consistent with a recurrent direct right inguinal hernia.  The patient now comes to Surgery for repair with mesh.  BODY OF REPORT:  Procedure was done in OR #11 at the [facility].  The patient was brought to the operating room, placed in supine position on the operating room table.  Following administration of general anesthesia, the patient was positioned and then prepped and draped in the usual strict aseptic fashion.  After ascertaining that an adequate level of anesthesia been achieved, a right inguinal incision was made somewhat inferior to his previous incision, which is well-healed.  Dissection was carried through the subcutaneous tissues and scar tissue.  External obliques fascial plane was developed. The fascia was incised in line with its fibers and extended through the external inguinal ring.  Cord structures were dissected out of the inguinal canal.  The floor of the inguinal canal was  dissected out. There was an obvious direct inguinal hernia defect.  Cord was briefly explored and there was no evidence of indirect inguinal hernia sac.  Next, the floor of the inguinal canal was recreated with a sheet of Ethicon UltraPro mesh.  Mesh was cut to the appropriate dimensions.  It was secured to the pubic tubercle along the inguinal ligament with a running 2-0 Novafil suture.  Mesh was split to accommodate the cord structures.  Superior margin of the mesh was secured to the transversalis and internal oblique fascia with interrupted 2-0 Novafil sutures.  Tails of the mesh were overlapped lateral to the cord structures and secured to the inguinal ligament with interrupted 2-0 Novafil sutures.  Local field block was placed with Marcaine.  Cord structures were returned to the inguinal canal.  External oblique fascia was closed with interrupted 3-0 Vicryl sutures.  Subcutaneous tissues were closed with interrupted 3-0 Vicryl sutures. Skin was anesthetized with local anesthetic.  Skin incision was closed with a running 4-0 Monocryl subcuticular suture.  Wound was washed and dried and benzoin Steri-Strips were applied.  Sterile dressings were applied.  The patient was awakened from anesthesia and brought to the recovery room.  The patient tolerated the procedure well.   Velora Heckler, MD, FACS  TMG/MEDQ  D:  07/05/2011  T:  07/06/2011  Job:  [id number]  cc:   Bertram Millard. Dahlstedt, M.D. Fax: [phone]

## 2011-07-09 ENCOUNTER — Telehealth (INDEPENDENT_AMBULATORY_CARE_PROVIDER_SITE_OTHER): Payer: Self-pay

## 2011-07-09 NOTE — Telephone Encounter (Signed)
Patient offered post op appointment for 07/17/2011 or 07/29/2011.  He wants to keep 07/29/2011 date. He said he is doing ok but has right side pain. Pain located under right rib  area that goes around the back. It increases with activity.  He will keep an eye on it.  I told him the information will be forwarded to Dr. Gerrit Friends.  RIH hernia repair with mesh on 07/05/2011.

## 2011-07-12 ENCOUNTER — Telehealth (INDEPENDENT_AMBULATORY_CARE_PROVIDER_SITE_OTHER): Payer: Self-pay | Admitting: General Surgery

## 2011-07-12 NOTE — Telephone Encounter (Signed)
Pt calling to report a pain in his "right external oblique" like a cord pulling or a muscle cramp.  It only hurts when he in in certain positions, which he was advised to avoid.  Recommended he use pain meds as directed and watch for now as initial healing is under way.  Give MD update at post op appt.

## 2011-07-17 ENCOUNTER — Encounter (INDEPENDENT_AMBULATORY_CARE_PROVIDER_SITE_OTHER): Payer: BC Managed Care – PPO | Admitting: Surgery

## 2011-07-17 ENCOUNTER — Encounter (HOSPITAL_COMMUNITY): Payer: Self-pay | Admitting: Surgery

## 2011-07-18 ENCOUNTER — Telehealth (INDEPENDENT_AMBULATORY_CARE_PROVIDER_SITE_OTHER): Payer: Self-pay | Admitting: General Surgery

## 2011-07-18 NOTE — Telephone Encounter (Signed)
Pt calling to request pain med refill; Hydrocodone 5/325 mg; # 30, 1 po Q 4-6 H prn pain, NO refill, called to [facility] at [phone].  Pt also states he has "a prostate infection" now, too.  He states he has had one before and recognizes the symptoms.  Pt was advised to see his PCP or urologist as soon as possible to evaluated and treat the infection.  He understands.

## 2011-07-29 ENCOUNTER — Ambulatory Visit (INDEPENDENT_AMBULATORY_CARE_PROVIDER_SITE_OTHER): Payer: BC Managed Care – PPO | Admitting: Surgery

## 2011-07-29 ENCOUNTER — Encounter (INDEPENDENT_AMBULATORY_CARE_PROVIDER_SITE_OTHER): Payer: Self-pay | Admitting: Surgery

## 2011-07-29 VITALS — BP 118/78 | HR 60 | Temp 97.7°F | Resp 14 | Ht 64.0 in | Wt 129.2 lb

## 2011-07-29 DIAGNOSIS — K4090 Unilateral inguinal hernia, without obstruction or gangrene, not specified as recurrent: Secondary | ICD-10-CM

## 2011-07-29 NOTE — Patient Instructions (Signed)
  COCOA BUTTER & VITAMIN E CREAM  ([employer]mer's or other brand)  Apply cocoa butter/vitamin E cream to your incision 2 - 3 times daily.  Massage cream into incision for one minute with each application.  Use sunscreen (50 SPF or higher) for first 6 months after surgery if area is exposed to sun.  You may substitute Mederma or other scar reducing creams as desired.   

## 2011-07-29 NOTE — Progress Notes (Signed)
Visit Diagnoses: 1. Inguinal hernia unilateral, non-recurrent, right     HISTORY: Patient returns for postoperative visit having undergone right inguinal hernia repair with mesh on July 05, 2011. Postoperative course has been relatively uneventful. He did have an episode of prostatitis which required treatment with antibiotics. This has now resolved.  EXAM: Surgical wound is healing nicely. No sign of infection. Minimal soft tissue swelling. Palpation in the inguinal canal shows no sign of recurrence.  IMPRESSION: Status post right inguinal hernia repair with mesh  PLAN: Patient will begin applying topical creams to his incision. He is released to begin aerobic activity. He will limit his lifting for the next 3 weeks. After that he may return to full physical activity without restriction.  Velora Heckler, MD, FACS General & Endocrine Surgery [facility]

## 2014-09-20 ENCOUNTER — Emergency Department (HOSPITAL_COMMUNITY): Admission: EM | Admit: 2014-09-20 | Discharge: 2014-09-20 | Discharge status: Absent without leave | Payer: Self-pay

## 2015-09-13 DIAGNOSIS — R109 Unspecified abdominal pain: Secondary | ICD-10-CM | POA: Diagnosis not present

## 2016-09-03 ENCOUNTER — Other Ambulatory Visit: Payer: Self-pay

## 2016-09-03 ENCOUNTER — Encounter (INDEPENDENT_AMBULATORY_CARE_PROVIDER_SITE_OTHER): Payer: Self-pay | Admitting: Orthopaedic Surgery

## 2016-09-03 ENCOUNTER — Ambulatory Visit (INDEPENDENT_AMBULATORY_CARE_PROVIDER_SITE_OTHER): Payer: BLUE CROSS/BLUE SHIELD | Admitting: Orthopaedic Surgery

## 2016-09-03 VITALS — BP 123/85 | HR 97 | Ht 64.0 in | Wt 129.0 lb

## 2016-09-03 DIAGNOSIS — M542 Cervicalgia: Secondary | ICD-10-CM

## 2016-09-03 DIAGNOSIS — R079 Chest pain, unspecified: Secondary | ICD-10-CM
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
## 2016-09-13 ENCOUNTER — Ambulatory Visit: Payer: Self-pay | Admitting: Cardiovascular Disease

## 2016-10-08 ENCOUNTER — Ambulatory Visit: Payer: Self-pay | Admitting: Interventional Cardiology

## 2016-11-15 DIAGNOSIS — Z0000 Encounter for general adult medical examination without abnormal findings: Secondary | ICD-10-CM | POA: Diagnosis not present

## 2016-11-22 ENCOUNTER — Encounter: Payer: Self-pay | Admitting: Interventional Cardiology

## 2016-11-22 ENCOUNTER — Ambulatory Visit (INDEPENDENT_AMBULATORY_CARE_PROVIDER_SITE_OTHER): Payer: BLUE CROSS/BLUE SHIELD | Admitting: Interventional Cardiology

## 2016-11-22 VITALS — BP 118/74 | HR 48 | Ht 66.0 in | Wt 128.2 lb

## 2016-11-22 DIAGNOSIS — M79602 Pain in left arm: Secondary | ICD-10-CM

## 2016-11-22 DIAGNOSIS — R0600 Dyspnea, unspecified: Secondary | ICD-10-CM | POA: Diagnosis not present

## 2016-11-22 DIAGNOSIS — E785 Hyperlipidemia, unspecified: Secondary | ICD-10-CM

## 2016-11-22 DIAGNOSIS — R079 Chest pain, unspecified: Secondary | ICD-10-CM | POA: Diagnosis not present
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
## 2016-11-22 NOTE — Patient Instructions (Signed)
Medication Instructions:  None  Labwork: None  Testing/Procedures: Your physician has requested that you have an exercise tolerance test. For further information please visit [email or web address]. Please also follow instruction sheet, as given.  Your physician would like for you to have a Calcium Score completed.   Follow-Up: Your physician recommends that you schedule a follow-up appointment as needed with Dr. Tamala Julian.    Any Other Special Instructions Will Be Listed Below (If Applicable).     If you need a refill on your cardiac medications before your next appointment, please call your pharmacy.

## 2016-12-04 ENCOUNTER — Ambulatory Visit (INDEPENDENT_AMBULATORY_CARE_PROVIDER_SITE_OTHER): Payer: BLUE CROSS/BLUE SHIELD

## 2016-12-04 ENCOUNTER — Ambulatory Visit (INDEPENDENT_AMBULATORY_CARE_PROVIDER_SITE_OTHER)
Admission: RE | Admit: 2016-12-04 | Discharge: 2016-12-04 | Disposition: A | Discharge status: Home / Self Care | Payer: Self-pay | Source: Ambulatory Visit | Attending: Interventional Cardiology | Admitting: Interventional Cardiology

## 2016-12-04 DIAGNOSIS — E785 Hyperlipidemia, unspecified: Secondary | ICD-10-CM | POA: Diagnosis not present

## 2016-12-04 DIAGNOSIS — R079 Chest pain, unspecified: Secondary | ICD-10-CM | POA: Diagnosis not present

## 2016-12-04 LAB — EXERCISE TOLERANCE TEST
CHL RATE OF PERCEIVED EXERTION: 16
CSEPED: 14 min
CSEPHR: 100 %
Estimated workload: 17.2 METS
Exercise duration (sec): 0 s
MPHR: 178 {beats}/min
Peak HR: 179 {beats}/min
Rest HR: 69 {beats}/min

## 2016-12-06 ENCOUNTER — Telehealth: Payer: Self-pay | Admitting: Interventional Cardiology

## 2016-12-06 NOTE — Telephone Encounter (Signed)
New message    Pt is calling asking for a call back. He said if he does not answer please leave a message.

## 2016-12-06 NOTE — Telephone Encounter (Signed)
Left message to call back  

## 2016-12-10 NOTE — Telephone Encounter (Signed)
Left message to call back  

## 2016-12-23 NOTE — Telephone Encounter (Signed)
Left message to call back  

## 2016-12-24 DIAGNOSIS — R5383 Other fatigue: Secondary | ICD-10-CM | POA: Diagnosis not present

## 2016-12-24 DIAGNOSIS — F419 Anxiety disorder, unspecified: Secondary | ICD-10-CM | POA: Diagnosis not present

## 2016-12-24 DIAGNOSIS — J029 Acute pharyngitis, unspecified: Secondary | ICD-10-CM | POA: Diagnosis not present

## 2016-12-24 DIAGNOSIS — F410 Panic disorder [episodic paroxysmal anxiety] without agoraphobia: Secondary | ICD-10-CM | POA: Diagnosis not present

## 2016-12-24 DIAGNOSIS — K219 Gastro-esophageal reflux disease without esophagitis: Secondary | ICD-10-CM | POA: Diagnosis not present

## 2017-01-29 DIAGNOSIS — K219 Gastro-esophageal reflux disease without esophagitis: Secondary | ICD-10-CM | POA: Diagnosis not present

## 2017-01-29 DIAGNOSIS — F410 Panic disorder [episodic paroxysmal anxiety] without agoraphobia: Secondary | ICD-10-CM | POA: Diagnosis not present

## 2017-02-04 DIAGNOSIS — R1312 Dysphagia, oropharyngeal phase: Secondary | ICD-10-CM | POA: Diagnosis not present

## 2017-02-04 DIAGNOSIS — R070 Pain in throat: Secondary | ICD-10-CM | POA: Diagnosis not present

## 2017-04-10 DIAGNOSIS — F4010 Social phobia, unspecified: Secondary | ICD-10-CM | POA: Diagnosis not present

## 2017-04-10 DIAGNOSIS — F410 Panic disorder [episodic paroxysmal anxiety] without agoraphobia: Secondary | ICD-10-CM | POA: Diagnosis not present

## 2017-05-06 DIAGNOSIS — R070 Pain in throat: Secondary | ICD-10-CM | POA: Diagnosis not present

## 2017-05-06 DIAGNOSIS — K219 Gastro-esophageal reflux disease without esophagitis: Secondary | ICD-10-CM | POA: Diagnosis not present

## 2017-06-04 DIAGNOSIS — F410 Panic disorder [episodic paroxysmal anxiety] without agoraphobia: Secondary | ICD-10-CM | POA: Diagnosis not present

## 2017-06-04 DIAGNOSIS — F4010 Social phobia, unspecified: Secondary | ICD-10-CM | POA: Diagnosis not present

## 2017-10-06 DIAGNOSIS — R109 Unspecified abdominal pain: Secondary | ICD-10-CM | POA: Diagnosis not present

## 2017-11-10 DIAGNOSIS — Z9889 Other specified postprocedural states: Secondary | ICD-10-CM | POA: Diagnosis not present

## 2017-11-10 DIAGNOSIS — R1031 Right lower quadrant pain: Secondary | ICD-10-CM | POA: Diagnosis not present

## 2017-11-10 DIAGNOSIS — Z8719 Personal history of other diseases of the digestive system: Secondary | ICD-10-CM | POA: Diagnosis not present

## 2018-08-21 DIAGNOSIS — F1020 Alcohol dependence, uncomplicated: Secondary | ICD-10-CM | POA: Diagnosis not present

## 2018-08-21 DIAGNOSIS — R0602 Shortness of breath: Secondary | ICD-10-CM | POA: Diagnosis not present

## 2018-08-21 DIAGNOSIS — R109 Unspecified abdominal pain: Secondary | ICD-10-CM | POA: Diagnosis not present

## 2018-09-11 ENCOUNTER — Other Ambulatory Visit (HOSPITAL_BASED_OUTPATIENT_CLINIC_OR_DEPARTMENT_OTHER): Payer: Self-pay | Admitting: Family Medicine

## 2018-09-11 DIAGNOSIS — R1084 Generalized abdominal pain: Secondary | ICD-10-CM

## 2018-09-17 ENCOUNTER — Other Ambulatory Visit: Payer: Self-pay

## 2018-09-17 ENCOUNTER — Ambulatory Visit (HOSPITAL_BASED_OUTPATIENT_CLINIC_OR_DEPARTMENT_OTHER)
Admission: RE | Admit: 2018-09-17 | Discharge: 2018-09-17 | Disposition: A | Discharge status: Home / Self Care | Payer: BC Managed Care – PPO | Source: Ambulatory Visit | Attending: Family Medicine | Admitting: Family Medicine

## 2018-09-17 DIAGNOSIS — R1084 Generalized abdominal pain: Secondary | ICD-10-CM

## 2018-09-17 DIAGNOSIS — R1012 Left upper quadrant pain: Secondary | ICD-10-CM | POA: Diagnosis not present

## 2018-09-17 DIAGNOSIS — R1011 Right upper quadrant pain: Secondary | ICD-10-CM | POA: Diagnosis not present

## 2018-11-17 DIAGNOSIS — H538 Other visual disturbances: Secondary | ICD-10-CM | POA: Diagnosis not present

## 2019-02-17 DIAGNOSIS — Z6821 Body mass index (BMI) 21.0-21.9, adult: Secondary | ICD-10-CM | POA: Diagnosis not present

## 2019-02-17 DIAGNOSIS — Z20828 Contact with and (suspected) exposure to other viral communicable diseases: Secondary | ICD-10-CM | POA: Diagnosis not present

## 2019-02-24 ENCOUNTER — Emergency Department (HOSPITAL_COMMUNITY)
Admission: EM | Admit: 2019-02-24 | Discharge: 2019-02-24 | Disposition: A | Discharge status: Home / Self Care | Payer: BC Managed Care – PPO | Attending: Emergency Medicine | Admitting: Emergency Medicine

## 2019-02-24 ENCOUNTER — Encounter (HOSPITAL_COMMUNITY): Payer: Self-pay | Admitting: Emergency Medicine

## 2019-02-24 ENCOUNTER — Other Ambulatory Visit: Payer: Self-pay

## 2019-02-24 DIAGNOSIS — Y939 Activity, unspecified: Secondary | ICD-10-CM | POA: Diagnosis not present

## 2019-02-24 DIAGNOSIS — Z23 Encounter for immunization: Secondary | ICD-10-CM | POA: Diagnosis not present

## 2019-02-24 DIAGNOSIS — S01511A Laceration without foreign body of lip, initial encounter: Secondary | ICD-10-CM | POA: Diagnosis not present

## 2019-02-24 DIAGNOSIS — Y929 Unspecified place or not applicable: Secondary | ICD-10-CM | POA: Insufficient documentation

## 2019-02-24 DIAGNOSIS — Y999 Unspecified external cause status: Secondary | ICD-10-CM | POA: Diagnosis not present

## 2019-02-24 DIAGNOSIS — W540XXA Bitten by dog, initial encounter: Secondary | ICD-10-CM | POA: Diagnosis not present

## 2019-02-24 DIAGNOSIS — S01551A Open bite of lip, initial encounter: Secondary | ICD-10-CM | POA: Diagnosis not present

## 2019-02-24 DIAGNOSIS — S0185XA Open bite of other part of head, initial encounter: Secondary | ICD-10-CM

## 2019-02-24 DIAGNOSIS — S0993XA Unspecified injury of face, initial encounter: Secondary | ICD-10-CM | POA: Diagnosis not present

## 2019-02-24 MED ORDER — HYDROCODONE-ACETAMINOPHEN 5-325 MG PO TABS: 1.0000 | ORAL_TABLET | ORAL | 0 refills | Status: DC | PRN

## 2019-02-24 MED ORDER — AMOXICILLIN-POT CLAVULANATE 875-125 MG PO TABS
1.0000 | ORAL_TABLET | Freq: Once | ORAL | Status: AC
  Administered 2019-02-24: 05:00:00 1 via ORAL
  Filled 2019-02-24: qty 1

## 2019-02-24 MED ORDER — HYDROCODONE-ACETAMINOPHEN 5-325 MG PO TABS
1.0000 | ORAL_TABLET | Freq: Once | ORAL | Status: AC
  Administered 2019-02-24: 03:00:00 1 via ORAL
  Filled 2019-02-24: qty 1

## 2019-02-24 MED ORDER — LIDOCAINE HCL (PF) 1 % IJ SOLN
5.0000 mL | Freq: Once | INTRAMUSCULAR | Status: AC
  Administered 2019-02-24: 03:00:00 5 mL
  Filled 2019-02-24: qty 30

## 2019-02-24 MED ORDER — TETANUS-DIPHTH-ACELL PERTUSSIS 5-2.5-18.5 LF-MCG/0.5 IM SUSP
0.5000 mL | Freq: Once | INTRAMUSCULAR | Status: AC
  Administered 2019-02-24: 05:00:00 0.5 mL via INTRAMUSCULAR
  Filled 2019-02-24: qty 0.5

## 2019-02-24 MED ORDER — AMOXICILLIN-POT CLAVULANATE 875-125 MG PO TABS: 1.0000 | ORAL_TABLET | Freq: Two times a day (BID) | ORAL | 0 refills | Status: DC

## 2019-02-24 NOTE — Discharge Instructions (Addendum)
The sutures to the external lip and below the lip will need removal in 5 days. It is important to see your doctor in 2 days for recheck to insure there is no infection.   Take medications as prescribed.
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
## 2019-02-24 NOTE — ED Triage Notes (Signed)
Patient was bit in the lip by his own dog. Dog shots are up to date.

## 2019-03-01 DIAGNOSIS — Z4802 Encounter for removal of sutures: Secondary | ICD-10-CM | POA: Diagnosis not present

## 2019-05-25 ENCOUNTER — Ambulatory Visit: Payer: BC Managed Care – PPO | Attending: Internal Medicine

## 2019-05-25 DIAGNOSIS — Z20822 Contact with and (suspected) exposure to covid-19: Secondary | ICD-10-CM | POA: Diagnosis not present

## 2019-05-26 LAB — NOVEL CORONAVIRUS, NAA: SARS-CoV-2, NAA: NOT DETECTED

## 2019-08-27 DIAGNOSIS — R1011 Right upper quadrant pain: Secondary | ICD-10-CM | POA: Diagnosis not present

## 2019-08-27 DIAGNOSIS — E559 Vitamin D deficiency, unspecified: Secondary | ICD-10-CM | POA: Diagnosis not present

## 2019-08-27 DIAGNOSIS — R5383 Other fatigue: Secondary | ICD-10-CM | POA: Diagnosis not present

## 2019-09-06 DIAGNOSIS — F1010 Alcohol abuse, uncomplicated: Secondary | ICD-10-CM | POA: Diagnosis not present

## 2019-09-06 DIAGNOSIS — R634 Abnormal weight loss: Secondary | ICD-10-CM | POA: Diagnosis not present

## 2019-09-06 DIAGNOSIS — R0781 Pleurodynia: Secondary | ICD-10-CM | POA: Diagnosis not present

## 2020-09-06 ENCOUNTER — Other Ambulatory Visit: Payer: Self-pay

## 2020-09-06 ENCOUNTER — Ambulatory Visit (INDEPENDENT_AMBULATORY_CARE_PROVIDER_SITE_OTHER): Payer: 59 | Admitting: Dermatology

## 2020-09-06 DIAGNOSIS — D179 Benign lipomatous neoplasm, unspecified: Secondary | ICD-10-CM | POA: Diagnosis not present

## 2020-09-06 DIAGNOSIS — Z1283 Encounter for screening for malignant neoplasm of skin: Secondary | ICD-10-CM | POA: Diagnosis not present

## 2020-09-10 ENCOUNTER — Encounter: Payer: Self-pay | Admitting: Dermatology

## 2020-09-10 NOTE — Progress Notes (Signed)
   Follow-Up Visit   Subjective  Juan Perez is a 45 y.o. male who presents for the following: Annual Exam (Here for annual skin exam. Concerns lump on patients back. ).  General skin examination Location: Possible newer under the skin above the left lower back Duration:  Quality:  Associated Signs/Symptoms: Modifying Factors:  Severity:  Timing: Context:   Objective  Well appearing patient in no apparent distress; mood and affect are within normal limits. Head to Toe Head to toe exam today no signs of atypical moles, melanoma or non mole skin cancer.   Left Lower Back Subtle moderately firm subcutaneous 2 sonometer nontender nodule left flank.  Although I favor this being a fibrolipoma, it is certainly not an diagnostic so we did discuss both excision as well as biopsy.  Juan Perez is comfortable leaving the lesion as long as it is clinically stable.    A full examination was performed including scalp, head, eyes, ears, nose, lips, neck, chest, axillae, abdomen, back, buttocks, bilateral upper extremities, bilateral lower extremities, hands, feet, fingers, toes, fingernails, and toenails. All findings within normal limits unless otherwise noted below.   Assessment & Plan    Skin exam for malignant neoplasm Head to Toe  Yearly skin check  Fibrolipoma Left Lower Back  Benign okay to leave, unless patient wants removed or clinically changes      I, Juan Monarch, MD, have reviewed all documentation for this visit.  The documentation on 09/10/20 for the exam, diagnosis, procedures, and orders are all accurate and complete.

## 2020-10-04 ENCOUNTER — Ambulatory Visit: Payer: BC Managed Care – PPO | Admitting: Dermatology

## 2020-11-16 IMAGING — US ULTRASOUND ABDOMEN COMPLETE
1 series · 14 of 25 positions shown · non-contrast
Comparison: None.

CLINICAL DATA: Patient with abdominal pain in the right upper and
left upper quadrant.

EXAM:
ABDOMEN ULTRASOUND COMPLETE

[Series 1: ultrasound abdomen complete · 14 of 131 slices shown]
[im 1/131]
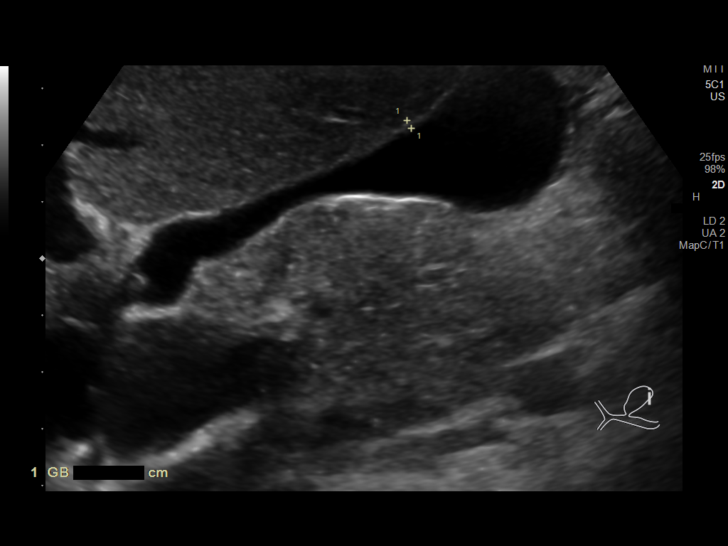
[im 11/131]
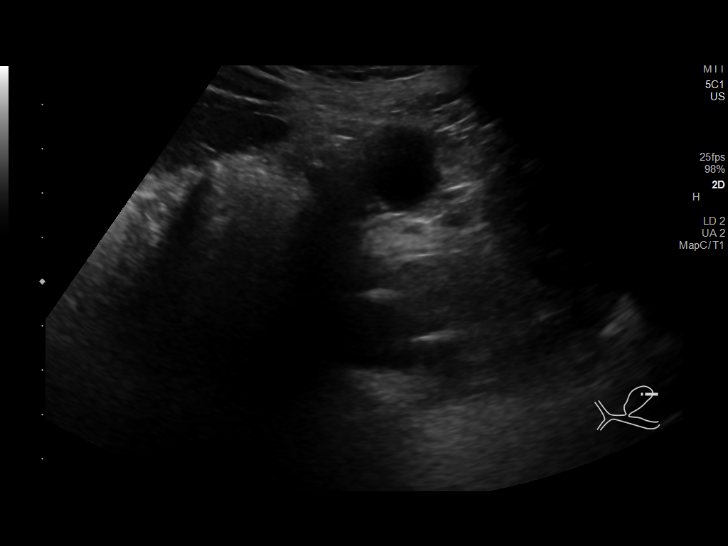
[im 22/131]
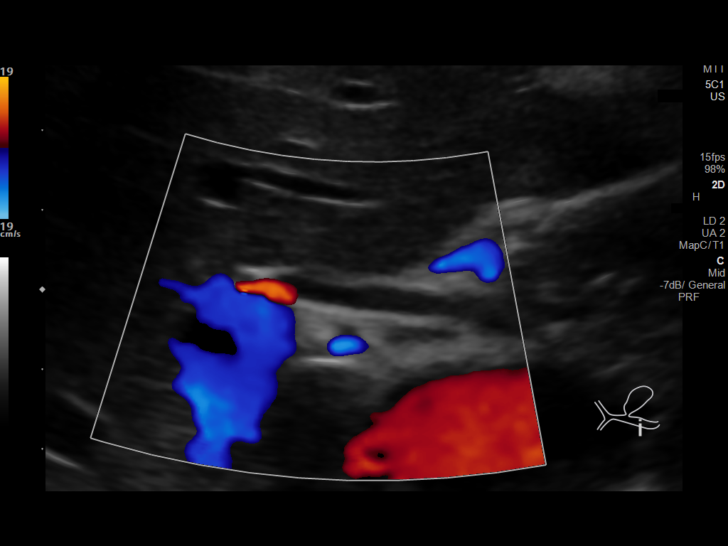
[im 33/131]
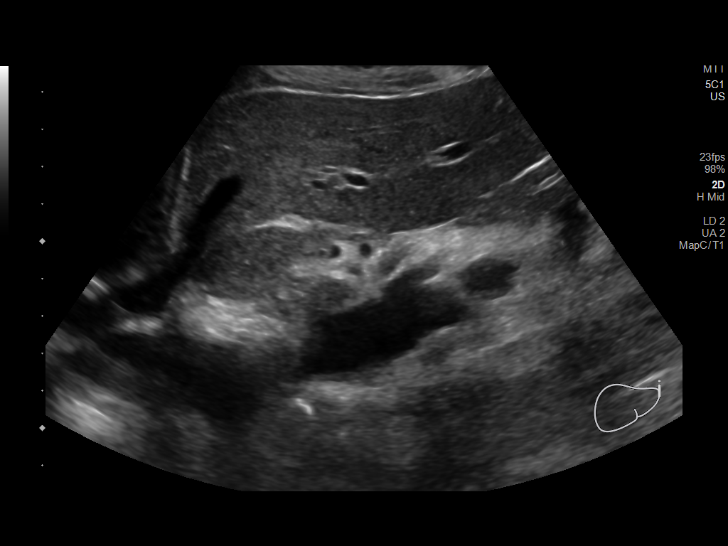
[im 44/131]
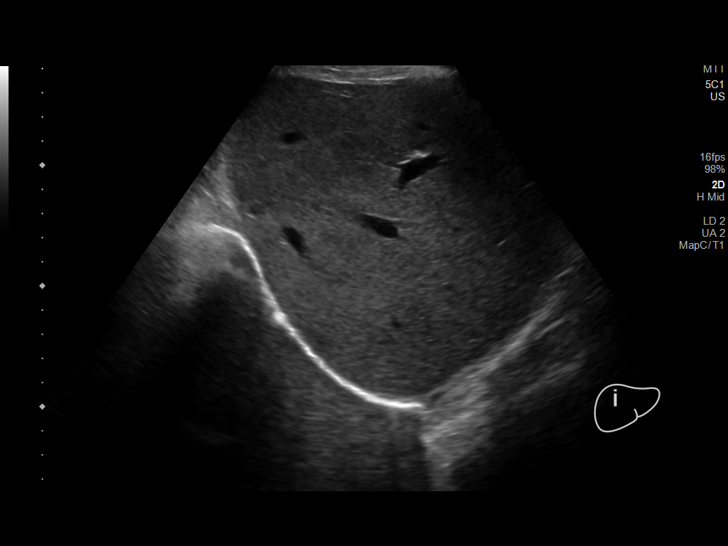
[im 49/131]
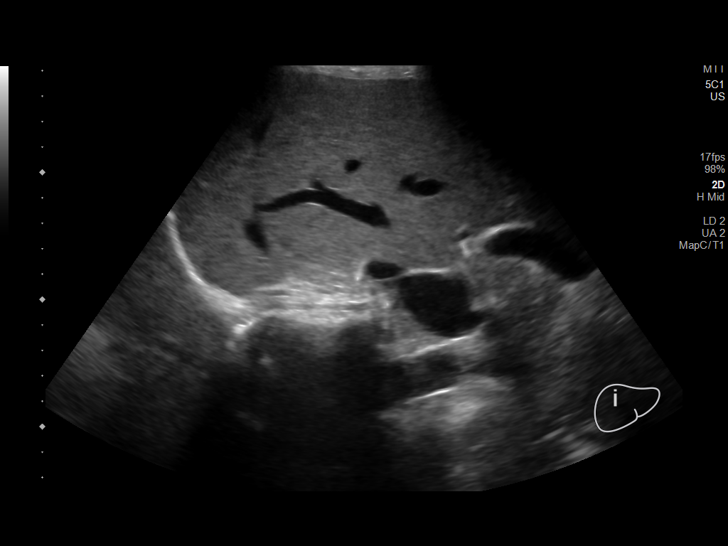
[im 60/131]
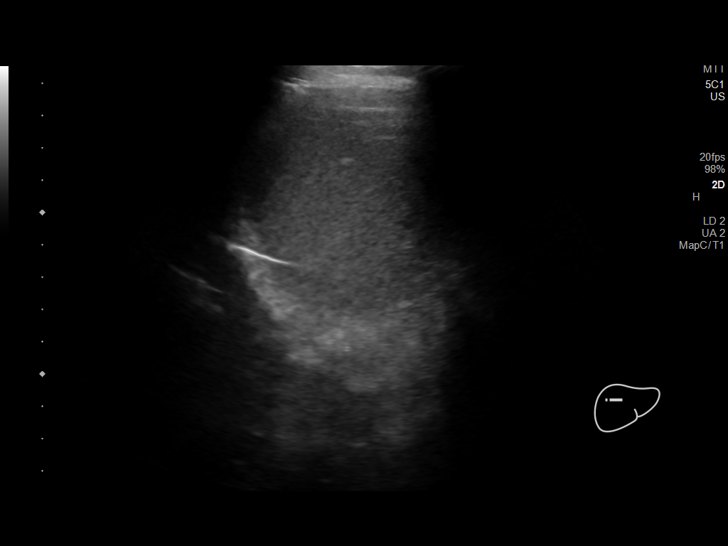
[im 71/131]
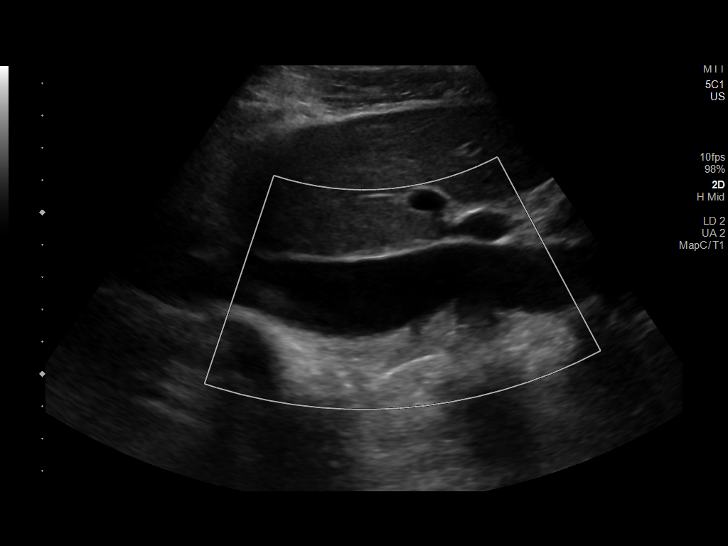
[im 82/131]
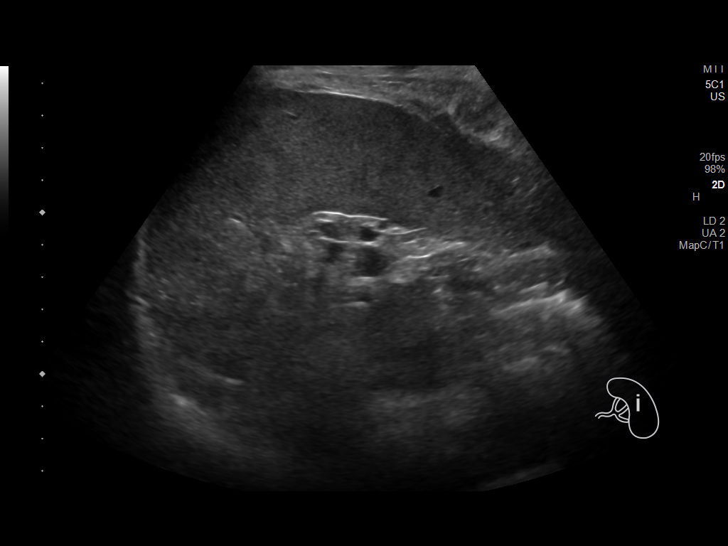
[im 87/131]
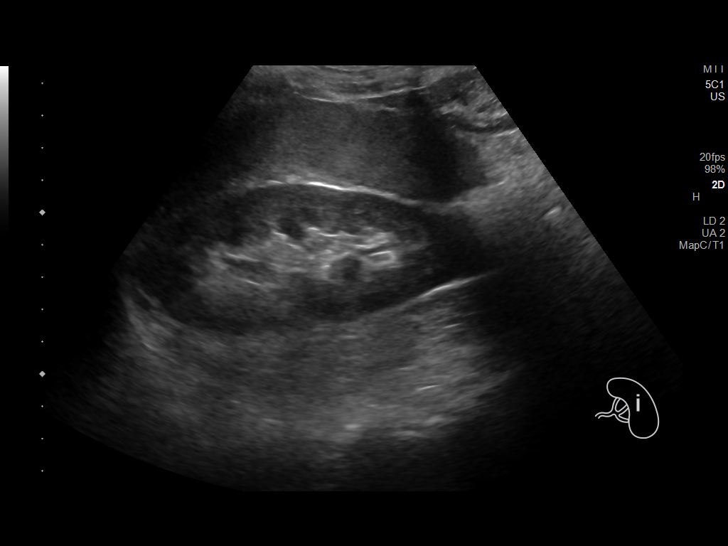
[im 98/131]
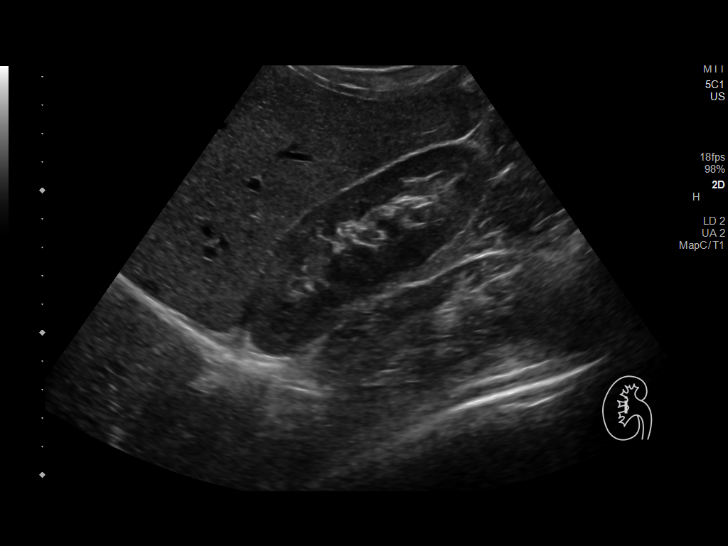
[im 109/131]
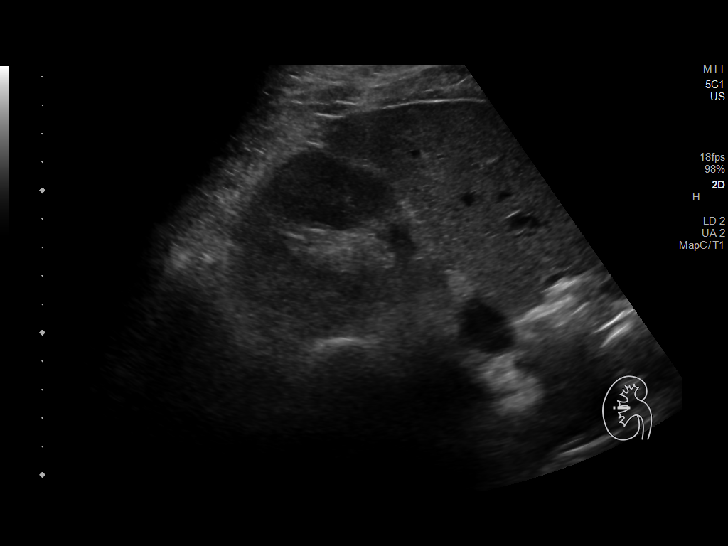
[im 120/131]
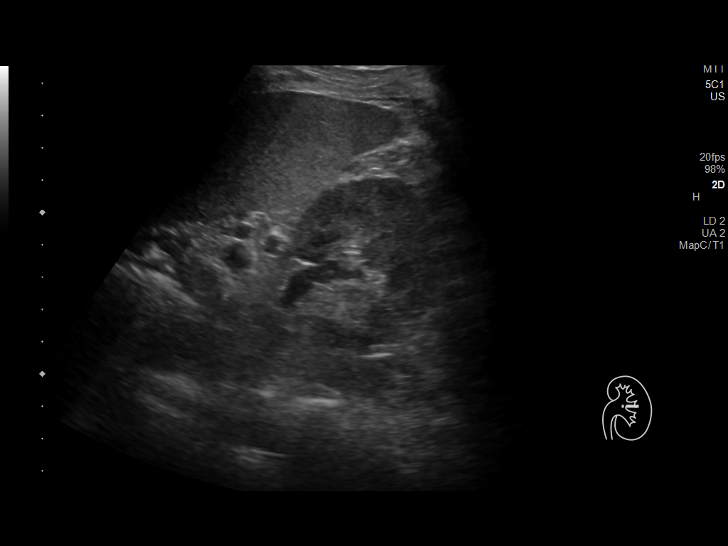
[im 131/131]
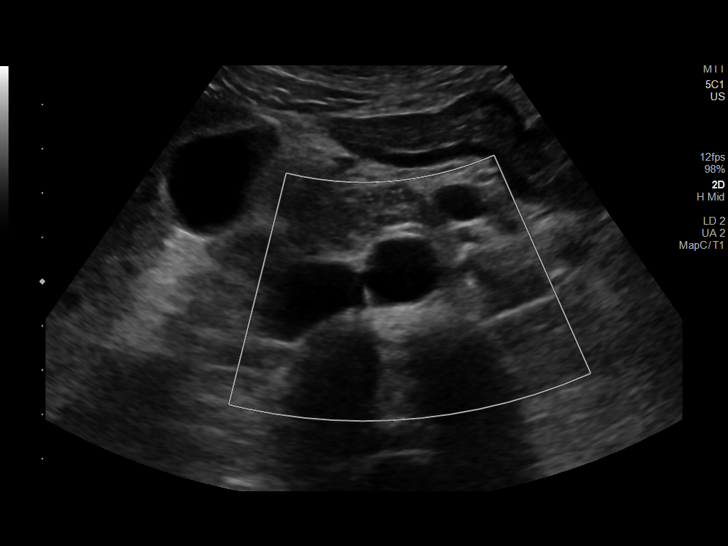

[14 of 25 positions shown; findings below may reference images not displayed]

FINDINGS: Gallbladder: No gallstones or wall thickening visualized. No
sonographic Murphy sign noted by sonographer.

Common bile duct: Diameter: 3 mm

Liver: Normal echogenicity. Subcentimeter echogenic lesion, too
small to characterize. Portal vein is patent on color Doppler
imaging with normal direction of blood flow towards the liver.

IVC: No abnormality visualized.

Pancreas: Visualized portion unremarkable.

Spleen: Size and appearance within normal limits.

Right Kidney: Length: 10.9 cm. Echogenicity within normal limits. No
mass or hydronephrosis visualized. Possible small cyst,
subcentimeter in size and too small to characterize.

Left Kidney: Length: 11.1 cm. Echogenicity within normal limits. No
mass or hydronephrosis visualized.

Abdominal aorta: No aneurysm visualized.

Other findings: None.
IMPRESSION: No acute process within the abdomen.
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
## 2021-09-26 ENCOUNTER — Telehealth: Payer: Self-pay

## 2021-09-26 ENCOUNTER — Encounter: Payer: Self-pay | Admitting: Interventional Cardiology

## 2021-09-26 ENCOUNTER — Ambulatory Visit (INDEPENDENT_AMBULATORY_CARE_PROVIDER_SITE_OTHER): Payer: 59 | Admitting: Interventional Cardiology

## 2021-09-26 VITALS — BP 112/72 | HR 65 | Ht 65.0 in | Wt 118.2 lb

## 2021-09-26 DIAGNOSIS — R0789 Other chest pain: Secondary | ICD-10-CM

## 2021-09-26 DIAGNOSIS — E785 Hyperlipidemia, unspecified: Secondary | ICD-10-CM

## 2021-09-26 NOTE — Telephone Encounter (Signed)
Left message for patient per his request to let him know Dr. Tamala Julian discussed a coronary CTA during office visit today. Informed patient that this CT scan does use contrast dye, we would need to have updated labs to check his kidney function if this were something he were to have done in the future and that metoprolol is given to slow the heart rate down enough to obtain clear images.  Provided office number for callback if any questions.

## 2021-09-26 NOTE — Patient Instructions (Signed)
Medication Instructions:  Your physician recommends that you continue on your current medications as directed. Please refer to the Current Medication list given to you today.  *If you need a refill on your cardiac medications before your next appointment, please call your pharmacy*  Lab Work: Same day as Calcium Score CT: fasting Lipid panel If you have labs (blood work) drawn today and your tests are completely normal, you will receive your results only by: MyChart Message (if you have MyChart) OR A paper copy in the mail If you have any lab test that is abnormal or we need to change your treatment, we will call you to review the results.  Testing/Procedures: Your physician has recommended you have a coronary calcium score performed.  Follow-Up: At [facility], you and your health needs are our priority.  As part of our continuing mission to provide you with exceptional heart care, we have created designated Provider Care Teams.  These Care Teams include your primary Cardiologist (physician) and Advanced Practice Providers (APPs -  Physician Assistants and Nurse Practitioners) who all work together to provide you with the care you need, when you need it.  Your next appointment:   As needed  The format for your next appointment:   In Person  Provider:   Sinclair Grooms, MD {   Important Information About Sugar

## 2021-09-28 ENCOUNTER — Other Ambulatory Visit: Payer: 59 | Admitting: *Deleted

## 2021-09-28 DIAGNOSIS — E785 Hyperlipidemia, unspecified: Secondary | ICD-10-CM

## 2021-09-28 LAB — LIPID PANEL
Chol/HDL Ratio: 2.6 ratio (ref 0.0–5.0)
Cholesterol, Total: 172 mg/dL (ref 100–199)
HDL: 67 mg/dL (ref >39)
LDL Chol Calc (NIH): 93 mg/dL (ref 0–99)
Triglycerides: 61 mg/dL (ref 0–149)
VLDL Cholesterol Cal: 12 mg/dL (ref 5–40)

## 2021-10-12 ENCOUNTER — Ambulatory Visit (HOSPITAL_BASED_OUTPATIENT_CLINIC_OR_DEPARTMENT_OTHER)
Admission: RE | Admit: 2021-10-12 | Discharge: 2021-10-12 | Disposition: A | Discharge status: Home / Self Care | Payer: 59 | Source: Ambulatory Visit | Attending: Interventional Cardiology | Admitting: Interventional Cardiology

## 2021-10-12 DIAGNOSIS — R0789 Other chest pain: Secondary | ICD-10-CM | POA: Insufficient documentation

## 2022-08-29 ENCOUNTER — Other Ambulatory Visit: Payer: Self-pay | Admitting: Surgery

## 2022-08-29 DIAGNOSIS — Z8719 Personal history of other diseases of the digestive system: Secondary | ICD-10-CM

## 2022-09-05 ENCOUNTER — Other Ambulatory Visit: Payer: Medicaid Other

## 2022-09-25 ENCOUNTER — Other Ambulatory Visit: Payer: Self-pay

## 2022-09-25 ENCOUNTER — Ambulatory Visit: Payer: Medicaid Other

## 2022-09-25 DIAGNOSIS — M25562 Pain in left knee: Secondary | ICD-10-CM | POA: Diagnosis present

## 2022-09-25 DIAGNOSIS — G8929 Other chronic pain: Secondary | ICD-10-CM | POA: Insufficient documentation
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
## 2022-10-01 NOTE — Addendum Note (Signed)
Addended by: Mauri Reading on: 10/01/2022 06:58 PM   Modules accepted: Orders

## 2022-10-02 ENCOUNTER — Ambulatory Visit: Payer: Medicaid Other | Admitting: Physical Therapy

## 2022-10-03 ENCOUNTER — Ambulatory Visit: Payer: Medicaid Other

## 2022-10-03 DIAGNOSIS — M25562 Pain in left knee: Secondary | ICD-10-CM | POA: Diagnosis not present

## 2022-10-03 DIAGNOSIS — G8929 Other chronic pain: Secondary | ICD-10-CM
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
## 2022-10-09 ENCOUNTER — Encounter: Payer: Self-pay | Admitting: Physical Therapy

## 2022-10-09 ENCOUNTER — Ambulatory Visit: Payer: Medicaid Other | Admitting: Physical Therapy

## 2022-10-09 DIAGNOSIS — G8929 Other chronic pain: Secondary | ICD-10-CM

## 2022-10-09 DIAGNOSIS — M25562 Pain in left knee: Secondary | ICD-10-CM | POA: Diagnosis not present
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
## 2022-10-11 ENCOUNTER — Ambulatory Visit: Payer: Medicaid Other

## 2022-10-17 ENCOUNTER — Ambulatory Visit: Payer: Medicaid Other

## 2022-10-17 DIAGNOSIS — G8929 Other chronic pain: Secondary | ICD-10-CM

## 2022-10-17 DIAGNOSIS — M25562 Pain in left knee: Secondary | ICD-10-CM | POA: Diagnosis not present
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
## 2022-10-24 ENCOUNTER — Ambulatory Visit: Payer: Medicaid Other | Attending: Orthopedic Surgery

## 2022-10-24 DIAGNOSIS — G8929 Other chronic pain: Secondary | ICD-10-CM | POA: Insufficient documentation

## 2022-10-24 DIAGNOSIS — M25562 Pain in left knee: Secondary | ICD-10-CM | POA: Diagnosis present
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
## 2022-10-30 ENCOUNTER — Ambulatory Visit: Payer: Medicaid Other | Admitting: Physical Therapy

## 2022-10-30 ENCOUNTER — Encounter: Payer: Self-pay | Admitting: Physical Therapy

## 2022-10-30 DIAGNOSIS — G8929 Other chronic pain: Secondary | ICD-10-CM

## 2022-10-30 DIAGNOSIS — M25562 Pain in left knee: Secondary | ICD-10-CM | POA: Diagnosis not present
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
## 2022-11-06 ENCOUNTER — Encounter: Payer: Self-pay | Admitting: Physical Therapy

## 2022-11-06 ENCOUNTER — Ambulatory Visit: Payer: Medicaid Other | Admitting: Physical Therapy

## 2022-11-06 DIAGNOSIS — M25562 Pain in left knee: Secondary | ICD-10-CM | POA: Diagnosis not present

## 2022-11-06 DIAGNOSIS — G8929 Other chronic pain: Secondary | ICD-10-CM
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
## 2023-01-10 ENCOUNTER — Ambulatory Visit: Payer: Medicaid Other

## 2023-01-29 ENCOUNTER — Ambulatory Visit
Admission: RE | Admit: 2023-01-29 | Discharge: 2023-01-29 | Disposition: A | Discharge status: Home / Self Care | Payer: Medicaid Other | Source: Ambulatory Visit | Attending: Surgery | Admitting: Surgery

## 2023-01-29 DIAGNOSIS — Z8719 Personal history of other diseases of the digestive system: Secondary | ICD-10-CM

## 2023-01-29 MED ORDER — IOPAMIDOL (ISOVUE-300) INJECTION 61%
200.0000 mL | Freq: Once | INTRAVENOUS | Status: AC | PRN
  Administered 2023-01-29: 75 mL via INTRAVENOUS
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
## 2023-02-03 ENCOUNTER — Other Ambulatory Visit: Payer: Self-pay

## 2023-02-03 ENCOUNTER — Ambulatory Visit: Payer: Medicaid Other | Attending: Orthopedic Surgery

## 2023-02-03 DIAGNOSIS — R293 Abnormal posture: Secondary | ICD-10-CM | POA: Diagnosis present

## 2023-02-03 DIAGNOSIS — M542 Cervicalgia: Secondary | ICD-10-CM | POA: Insufficient documentation
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
## 2023-02-14 ENCOUNTER — Ambulatory Visit: Payer: Medicaid Other

## 2023-02-14 ENCOUNTER — Telehealth: Payer: Self-pay

## 2023-02-14 NOTE — Telephone Encounter (Signed)
TC due to missed visit.  VM left instructing patient to call front office to schedule additional visits as no further sessions were scheduled.

## 2023-02-14 NOTE — Progress Notes (Signed)
CT scan does not show any evidence of recurrent inguinal hernia, consistent with my exam.  I reviewed the images and do not see any hernia or any problems on the perineum.  The intussusception is usually a transient phenomena and likely will not be present on a follow up exam.  They did not see any mass or leading point to cause an intussusception.  Probably best to follow up with your primary care provider or GI doc for possible repeat scan as suggested in 6 weeks.  Darnell Level, MD [facility] practice Office: [phone]

## 2023-02-25 ENCOUNTER — Other Ambulatory Visit: Payer: Self-pay | Admitting: Gastroenterology

## 2023-02-25 DIAGNOSIS — R935 Abnormal findings on diagnostic imaging of other abdominal regions, including retroperitoneum: Secondary | ICD-10-CM

## 2023-03-12 ENCOUNTER — Other Ambulatory Visit: Payer: Medicaid Other

## 2023-03-28 ENCOUNTER — Other Ambulatory Visit: Payer: Medicaid Other

## 2023-05-14 ENCOUNTER — Ambulatory Visit: Payer: Medicaid Other
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
## 2023-06-02 ENCOUNTER — Ambulatory Visit: Payer: Medicaid Other

## 2023-06-10 ENCOUNTER — Ambulatory Visit: Attending: Orthopedic Surgery

## 2023-06-10 ENCOUNTER — Other Ambulatory Visit: Payer: Self-pay

## 2023-06-10 DIAGNOSIS — R293 Abnormal posture: Secondary | ICD-10-CM | POA: Insufficient documentation

## 2023-06-10 DIAGNOSIS — M542 Cervicalgia: Secondary | ICD-10-CM | POA: Diagnosis present
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
## 2023-06-17 ENCOUNTER — Ambulatory Visit

## 2023-06-17 DIAGNOSIS — R293 Abnormal posture: Secondary | ICD-10-CM

## 2023-06-17 DIAGNOSIS — M542 Cervicalgia: Secondary | ICD-10-CM
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
## 2023-06-24 ENCOUNTER — Ambulatory Visit
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
## 2023-07-02 ENCOUNTER — Ambulatory Visit: Attending: Orthopedic Surgery

## 2023-07-02 DIAGNOSIS — R293 Abnormal posture: Secondary | ICD-10-CM | POA: Insufficient documentation

## 2023-07-02 DIAGNOSIS — M542 Cervicalgia: Secondary | ICD-10-CM | POA: Insufficient documentation
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
## 2023-07-10 ENCOUNTER — Ambulatory Visit
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
## 2023-07-17 ENCOUNTER — Ambulatory Visit

## 2023-07-29 ENCOUNTER — Encounter: Payer: Self-pay | Admitting: Dermatology

## 2023-07-29 ENCOUNTER — Ambulatory Visit: Admitting: Dermatology

## 2023-07-29 VITALS — BP 128/74 | HR 67

## 2023-07-29 DIAGNOSIS — R221 Localized swelling, mass and lump, neck: Secondary | ICD-10-CM | POA: Diagnosis not present

## 2023-07-29 DIAGNOSIS — L821 Other seborrheic keratosis: Secondary | ICD-10-CM

## 2023-07-29 DIAGNOSIS — Z1283 Encounter for screening for malignant neoplasm of skin: Secondary | ICD-10-CM | POA: Diagnosis not present

## 2023-07-29 DIAGNOSIS — D1801 Hemangioma of skin and subcutaneous tissue: Secondary | ICD-10-CM | POA: Diagnosis not present

## 2023-07-29 DIAGNOSIS — L578 Other skin changes due to chronic exposure to nonionizing radiation: Secondary | ICD-10-CM

## 2023-07-29 DIAGNOSIS — D229 Melanocytic nevi, unspecified: Secondary | ICD-10-CM

## 2023-07-29 DIAGNOSIS — R229 Localized swelling, mass and lump, unspecified: Secondary | ICD-10-CM

## 2023-07-29 DIAGNOSIS — L814 Other melanin hyperpigmentation: Secondary | ICD-10-CM | POA: Diagnosis not present

## 2023-07-29 DIAGNOSIS — W908XXA Exposure to other nonionizing radiation, initial encounter: Secondary | ICD-10-CM

## 2023-07-29 NOTE — Progress Notes (Signed)
   New Patient Visit   Subjective  Juan Perez is a 48 y.o. male who presents for the following: Skin Cancer Screening and Full Body Skin Exam  The patient presents for Total-Body Skin Exam (TBSE) for skin cancer screening and mole check. The patient has spots, moles and lesions to be evaluated, some may be new or changing.  He expresses concern over a lesion on his right posterior scalp, present for years, states it gets tender at times, not previously treated  Pt has no hx of skin cancer no family hx  The following portions of the chart were reviewed this encounter and updated as appropriate: medications, allergies, medical history  Review of Systems:  No other skin or systemic complaints except as noted in HPI or Assessment and Plan.  Objective  Well appearing patient in no apparent distress; mood and affect are within normal limits.  A full examination was performed including scalp, head, eyes, ears, nose, lips, neck, chest, axillae, abdomen, back, buttocks, bilateral upper extremities, bilateral lower extremities, hands, feet, fingers, toes, fingernails, and toenails. All findings within normal limits unless otherwise noted below.   Relevant physical exam findings are noted in the Assessment and Plan.    Assessment & Plan   SKIN CANCER SCREENING PERFORMED TODAY.  ACTINIC DAMAGE - Chronic condition, secondary to cumulative UV/sun exposure - diffuse scaly erythematous macules with underlying dyspigmentation - Recommend daily broad spectrum sunscreen SPF 30+ to sun-exposed areas, reapply every 2 hours as needed.  - Staying in the shade or wearing long sleeves, sun glasses (UVA+UVB protection) and wide brim hats (4-inch brim around the entire circumference of the hat) are also recommended for sun protection.  - Call for new or changing lesions.  MELANOCYTIC NEVI - Tan-brown and/or pink-flesh-colored symmetric macules and papules - Benign appearing on exam today -  Observation - Call clinic for new or changing moles - Recommend daily use of broad spectrum spf 30+ sunscreen to sun-exposed areas.   LENTIGINES Exam: scattered tan macules Due to sun exposure Treatment Plan: Benign-appearing, observe. Recommend daily broad spectrum sunscreen SPF 30+ to sun-exposed areas, reapply every 2 hours as needed.  Call for any changes   HEMANGIOMA Exam: red papule(s) Discussed benign nature. Recommend observation. Call for changes.   SEBORRHEIC KERATOSIS - Stuck-on, waxy, tan-brown papules and/or plaques  - Benign-appearing - Discussed benign etiology and prognosis. - Observe - Call for any changes  Subcutaneous Mass- right lateral posterior neck   On physical examination, the patient was noted to have a very subtle, barely palpable subcutaneous mass located on the right lateral aspect of the neck. Given the depth and location of the lesion, it does not appear to be superficial enough for straightforward excision by Dermatology. The characteristics of the mass warrant further evaluation to determine its nature and appropriate management. The patient was counseled on the importance of follow-up with Head and Neck Surgery for a more detailed assessment and possible imaging or biopsy, as this specialty is better equipped to evaluate and manage deeper or anatomically complex lesions in this region. The patient verbalized understanding and agreed to the referral. - Ambulatory referral to ENT  Return if symptoms worsen or fail to improve.  I, Wilson Hasten, CMA, am acting as scribe for Deneise Finlay, MD.   Documentation: I have reviewed the above documentation for accuracy and completeness, and I agree with the above.  Deneise Finlay, MD

## 2023-07-29 NOTE — Patient Instructions (Addendum)

## 2023-08-21 ENCOUNTER — Telehealth (INDEPENDENT_AMBULATORY_CARE_PROVIDER_SITE_OTHER): Payer: Self-pay | Admitting: Otolaryngology

## 2023-08-21 NOTE — Telephone Encounter (Signed)
 Left voicemail with location and appt information.

## 2023-08-22 ENCOUNTER — Encounter (INDEPENDENT_AMBULATORY_CARE_PROVIDER_SITE_OTHER): Payer: Self-pay | Admitting: Otolaryngology

## 2023-08-22 ENCOUNTER — Ambulatory Visit (INDEPENDENT_AMBULATORY_CARE_PROVIDER_SITE_OTHER): Admitting: Otolaryngology

## 2023-08-22 VITALS — BP 122/73 | HR 57 | Ht 65.0 in | Wt 125.0 lb

## 2023-08-22 DIAGNOSIS — M542 Cervicalgia: Secondary | ICD-10-CM | POA: Diagnosis not present

## 2023-08-22 DIAGNOSIS — R221 Localized swelling, mass and lump, neck: Secondary | ICD-10-CM | POA: Diagnosis not present

## 2023-08-22 DIAGNOSIS — R519 Headache, unspecified: Secondary | ICD-10-CM | POA: Diagnosis not present
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
## 2023-08-26 ENCOUNTER — Encounter: Payer: Self-pay | Admitting: Neurology

## 2023-09-05 ENCOUNTER — Other Ambulatory Visit: Payer: Self-pay | Admitting: Orthopedic Surgery

## 2023-09-05 DIAGNOSIS — M542 Cervicalgia: Secondary | ICD-10-CM

## 2023-09-12 ENCOUNTER — Ambulatory Visit (HOSPITAL_COMMUNITY)
Admission: RE | Admit: 2023-09-12 | Discharge: 2023-09-12 | Disposition: A | Discharge status: Home / Self Care | Source: Ambulatory Visit | Attending: Otolaryngology | Admitting: Otolaryngology

## 2023-09-12 ENCOUNTER — Encounter (HOSPITAL_COMMUNITY): Payer: Self-pay

## 2023-09-12 DIAGNOSIS — R221 Localized swelling, mass and lump, neck: Secondary | ICD-10-CM

## 2023-09-12 MED ORDER — SODIUM CHLORIDE (PF) 0.9 % IJ SOLN
INTRAMUSCULAR | Status: AC
  Filled 2023-09-12: qty 50

## 2023-09-12 MED ORDER — IOHEXOL 300 MG/ML  SOLN
75.0000 mL | Freq: Once | INTRAMUSCULAR | Status: AC | PRN
  Administered 2023-09-12: 75 mL via INTRAVENOUS

## 2023-09-16 ENCOUNTER — Ambulatory Visit (INDEPENDENT_AMBULATORY_CARE_PROVIDER_SITE_OTHER): Payer: Self-pay

## 2023-09-16 NOTE — Telephone Encounter (Signed)
 I let the patient know that we had some U/S results for him and for him to give us  a call back.

## 2023-09-17 ENCOUNTER — Telehealth (INDEPENDENT_AMBULATORY_CARE_PROVIDER_SITE_OTHER): Payer: Self-pay

## 2023-09-17 NOTE — Telephone Encounter (Signed)
Patient called to get the test results.

## 2023-09-22 ENCOUNTER — Telehealth (INDEPENDENT_AMBULATORY_CARE_PROVIDER_SITE_OTHER): Payer: Self-pay

## 2023-09-22 NOTE — Telephone Encounter (Signed)
 Patient called agitated wanting to get the results from his recent scans and wanted to know what took so long getting his results.  I gave patient his results, patient also asked about getting his scans from radiology for a second opinion and I gave him the number.
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
## 2023-10-16 ENCOUNTER — Ambulatory Visit: Admitting: Neurology

## 2023-10-16 ENCOUNTER — Encounter: Payer: Self-pay | Admitting: Neurology

## 2023-10-16 VITALS — BP 109/71 | HR 52 | Ht 65.0 in | Wt 129.0 lb

## 2023-10-16 DIAGNOSIS — M542 Cervicalgia: Secondary | ICD-10-CM

## 2023-10-16 DIAGNOSIS — G43009 Migraine without aura, not intractable, without status migrainosus: Secondary | ICD-10-CM | POA: Diagnosis not present

## 2023-10-16 DIAGNOSIS — H5319 Other subjective visual disturbances: Secondary | ICD-10-CM

## 2023-10-16 DIAGNOSIS — H539 Unspecified visual disturbance: Secondary | ICD-10-CM

## 2023-10-16 NOTE — Patient Instructions (Addendum)
 I saw you today for headaches, neck pain, and vision changes. It sounds like you have both migraines and tension headaches. The vision changes appear distinct and I think your research suggesting visual snow syndrome is reasonable.   This is a [employer] about visual snow syndrome:  [email or web address]  I will also order an MRI brain to make sure there is no other causes to explain your pains and vision changes.  A referral to [facility] has been placed for your MRI someone will contact you directly to schedule your appt. They are located at [address] [facility]. Please contact them directly by calling [phone] with any questions regarding your referral.   I will be in touch when I have your results.  The physicians and staff at [facility] are committed to providing excellent care. You may receive a survey requesting feedback about your experience at our office. We strive to receive very good responses to the survey questions. If you feel that your experience would prevent you from giving the office a very good  response, please contact our office to try to remedy the situation. We may be reached at [phone]. Thank you for taking the time out of your busy day to complete the survey.  Venetia Potters, MD [facility]   More migraine/headache information: Be aware of common food triggers:  - Caffeine:  coffee, black tea, cola, Mt. Dew  - Chocolate  - Dairy:  aged cheeses (brie, blue, cheddar, gouda, Parmasan, provolone, romano, Swiss, etc), chocolate milk, buttermilk, sour cream, limit eggs and yogurt  - Nuts, peanut butter  - Alcohol  - Cereals/grains:  FRESH breads (fresh bagels, sourdough, doughnuts), yeast productions  - Processed/canned/aged/cured meats (pre-packaged deli meats, hotdogs)  - MSG/glutamate:  soy sauce, flavor enhancer, pickled/preserved/marinated foods  - Sweeteners:   aspartame (Equal, Nutrasweet).  Sugar and Splenda are okay  - Vegetables:  legumes (lima beans, lentils, snow peas, fava beans, pinto peans, peas, garbanzo beans), sauerkraut, onions, olives, pickles  - Fruit:  avocados, bananas, citrus fruit (orange, lemon, grapefruit), mango  - Other:  Frozen meals, macaroni and cheese Routine exercise Stay adequately hydrated (aim for 64 oz water daily) Keep headache diary Maintain proper stress management Maintain proper sleep hygiene Do not skip meals Consider supplements:  magnesium citrate 400mg  daily, riboflavin 400mg  daily, coenzyme Q10 100mg  three times daily.

## 2023-10-31 ENCOUNTER — Encounter: Payer: Self-pay | Admitting: Neurology

## 2023-11-15 ENCOUNTER — Inpatient Hospital Stay: Admission: RE | Admit: 2023-11-15 | Source: Ambulatory Visit
